# Patient Record
Sex: Female | Born: 1940
Health system: Southern US, Community
[De-identification: ages and names within clinical notes are randomized; demographics above are authoritative.]

## PROBLEM LIST (undated history)

## (undated) DIAGNOSIS — G4730 Sleep apnea, unspecified: Secondary | ICD-10-CM

## (undated) DIAGNOSIS — M1990 Unspecified osteoarthritis, unspecified site: Secondary | ICD-10-CM

## (undated) DIAGNOSIS — E119 Type 2 diabetes mellitus without complications: Secondary | ICD-10-CM

## (undated) DIAGNOSIS — I10 Essential (primary) hypertension: Secondary | ICD-10-CM

## (undated) DIAGNOSIS — E785 Hyperlipidemia, unspecified: Secondary | ICD-10-CM

## (undated) DIAGNOSIS — K219 Gastro-esophageal reflux disease without esophagitis: Secondary | ICD-10-CM

## (undated) HISTORY — PX: TUBAL LIGATION: SHX77

## (undated) HISTORY — PX: CHOLECYSTECTOMY: SHX55

---

## 2001-11-03 ENCOUNTER — Ambulatory Visit (HOSPITAL_COMMUNITY): Admission: RE | Admit: 2001-11-03 | Discharge: 2001-11-03 | Payer: Self-pay | Admitting: Internal Medicine

## 2006-12-22 ENCOUNTER — Ambulatory Visit: Payer: Self-pay | Admitting: Cardiology

## 2006-12-26 ENCOUNTER — Ambulatory Visit: Payer: Self-pay | Admitting: Physician Assistant

## 2007-02-03 ENCOUNTER — Ambulatory Visit: Payer: Self-pay | Admitting: Cardiology

## 2010-07-31 NOTE — Assessment & Plan Note (Signed)
[facility]                          [facility] NOTE   AVILYN, VIRTUE                         MRN:          [id number]  DATE:12/26/2006                            DOB:          11-Aug-1940    REASON FOR REFERRAL:  Chest pain and shortness of breath.   HISTORY OF PRESENT ILLNESS:  Ms. Kristin Barnes is a 70 year old female patient  with essentially a negative past medical history who presents to the  office today for further evaluation of chest pain and shortness of  breath.  She awoke last week from sleep with chest discomfort that she  described as tight.  She had associated shortness of breath,  diaphoresis, and nausea.  It lasted about an hour.  She got up and drank  some soda water as well as some Coca Cola.  She eventually belched and  the symptoms resolved.  She felt somewhat lightheaded but denied any  syncope or near syncope with this.  Over the last couple of years she  has noticed some shortness of breath with exertion.  She denies any  exertional chest pain.  She does note that if she walks up steps she  will get short of breath.  She really describes NYHA Class II-IIB  symptoms.  She sleeps on 2 pillows and has done this for a couple of  years.  She does awaken from time-to-time short of breath.  It is  uncertain whether or not she is really describing PND.  She denies any  pedal edema.  She denies any exertional jaw or arm pain or exertional  nausea or diaphoresis.  She does have some palpitations especially when  she exerts herself and becomes short of breath.   PAST MEDICAL HISTORY:  She denies any history of coronary disease,  hypertension, diabetes, hyperlipidemia, or stroke.  She does note a  history of cholecystectomy.   MEDICATIONS:  None.   ALLERGIES:  None.   SOCIAL HISTORY:  She denies tobacco abuse.  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  She has 2 children and is married for 26 years.   FAMILY HISTORY:  Significant for  CAD.  Her father died of a heart attack  in his 28s.   REVIEW OF SYSTEMS:  Please see HPI.  She does note increased belching  and water brash symptoms.  She denies melena, hematochezia.  She denies  dysphagia, odynophagia.  She denies fevers, chills, cough.  She denies  unilateral weakness, difficulty with speech, facial droop, or monocular  blindness.  She denies any claudication symptoms.  She does note  increased fatigue.  She does note snoring and awakening with a headache.  The rest of the review of systems is negative.   PHYSICAL EXAMINATION:  GENERAL:  She is a well nourished, well developed  female in no distress.  VITAL SIGNS:  Blood pressure 133/76, pulse 85, weight 226.6 pounds.  HEENT:  Normal.  NECK:  Without JVD.  LYMPH:  Without lymphadenopathy.  ENDOCRINE:  Without thyromegaly.  CARDIAC:  Normal S1 S2.  Regular rate and rhythm without murmurs.  LUNGS:  Clear to auscultation bilaterally without wheezes, rhonchi, or  rales.  ABDOMEN:  Soft, nontender with normoactive bowel sounds.  No  organomegaly.  EXTREMITIES:  Without edema.  Calves soft, nontender.  SKIN:  Warm and dry.  NEUROLOGIC:  She is alert and oriented x3.  Cranial nerves II-XII  grossly intact.  Carotids are without bruits bilaterally.   Electrocardiogram reveals a sinus rhythm, heart rate is 83, normal axis,  no acute changes.   DATABASE:  The patient underwent laboratory testing and stress testing  as ordered by Dr. Loney Hering.  This included a D-dimer which was abnormal at  1.12.  CBC was normal.  BUN 12, creatinine 1.2.  CT angiogram of the  chest on December 23, 2006, revealed no evidence of pulmonary embolism or  acute process.  She did have thyromegaly with an element of substernal  goiter.  Stress testing done December 22, 2006, revealed normal LV  profusion with a normal EF of 71%.  Lower extremity Dopplers revealed no  evidence of DVT.  She did have mild degenerative spondylosis on cervical  spine  x-ray.   IMPRESSION:  1. Chest pain.  2. Dyspnea on exertion.  3. Fatigue/snoring.      a.     Rule out sleep apnea.  4. Probable gastroesophageal reflux disease.  5. 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  6. Good left ventricular function.  7. Nonischemic dobutamine Cardiolite, October 2008.  8. Obesity.  9. Remote family history of coronary artery disease.   PLAN:  The patient presents to the office today for further evaluation  of shortness of breath and chest pain.  She had a recent stress  Cardiolite study that was normal.  She does have symptoms that sound  somewhat consistent with GERD.  She also has some symptoms of snoring as  well as fatigue.  I discussed her case further with Dr. Andee Lineman today.   At this point, we will plan to:  1. Initiate proton pump inhibitor for possible GERD.  She will take      omeprazole 20 mg a day.  2. We will set her up with [facility] to rule out the possibility of      sleep apnea.  3. I have asked her to a start diet and exercise program.  She should      try to walk at least 30 minutes a day.  I have also asked her to      look into the [facility] Diet for weight loss.  I have asked her      loose at least 5 pounds before she comes back to see [place].  4. We will bring her back in followup in the next 4-6 weeks to review      her symptoms.  If there has been no improvement or if she has any      worsening symptoms, we may want to      consider further cardiac testing.  Otherwise, no further cardiac      testing is warranted at this point in time.      Tereso Newcomer, PA-C  Electronically Signed      Learta Codding, MD,FACC  Electronically Signed   SW/MedQ  DD: 12/26/2006  DT: 12/26/2006  Job #: [id number]   cc:   Ernestine Conrad, Dr.

## 2010-07-31 NOTE — Assessment & Plan Note (Signed)
[facility]                          [facility] NOTE   Kristin Barnes, Kristin Barnes                         MRN:          [id number]  DATE:02/03/2007                            DOB:          1940/11/21    PRIMARY CARDIOLOGIST:  Dr. Lewayne Bunting.   REASON FOR VISIT:  Scheduled 68-month followup.  Please refer to [name] initial consultation note of October 10th, for full  details.   Since last seen in the clinic, patient has had overnight sleep apnea  screening which did warrant further evaluation.  She was thus referred  to Dr. Ninetta Lights and reports today that she is scheduled for a formal  sleep study on December 13th.   When last seen, patient also presented with symptoms suggestive of GERD.  She was placed on omeprazole 20 daily and reports today a significant  palpable improvement on this medication.  She now has only had  occasional sharp twinges of chest pain in the right upper chest, which  are not strictly correlated with any exertion.  In general, she feels  much better since her last office visit.   CURRENT MEDICATIONS:  1. Ciprofloxacin 500 b.i.d.  2. Metronidazole 250 t.i.d.  3. Omeprazole 20 daily.   PHYSICAL EXAMINATION:  Blood pressure 134/69.  Pulse 74, regular.  Weight 226.6.  GENERAL:  A 70 year old female, moderately obese, sitting upright in no  distress.  HEENT:  Normocephalic and atraumatic.  NECK:  Palpable bilateral carotid pulses without bruits.  Unable to  assess JVD secondary to neck girth.  LUNGS:  Diminished breath sounds in the bases, but without crackles or  wheezes.  HEART:  Regular rate and rhythm (S1 and S2).  No significant murmurs.  ABDOMEN:  Protuberant.  Nontender.  Intact bowel sounds.  EXTREMITIES:  Trace left lower extremity edema.  NEUROLOGIC:  Alert and oriented.   IMPRESSION:  1. Atypical chest pain.      a.     Improved on proton pump inhibitor.      b.     Normal dobutamine stress  Cardiolite; ejection fraction 71%,       October 2008.  2. Probable gastroesophageal reflux disease.  3. Question obstructive sleep apnea.      a.     Formal sleep study scheduled.  4. Obesity.  5. Mild lower extremity edema.   PLAN:  No further cardiac workup is indicated at this time.  Patient's  symptoms have improved with a proton pump inhibitor and are suggestive  of probable GERD.  In light of a recent normal dobutamine stress  Cardiolite, therefore, no further ischemic workup is currently  indicated.  Patient is instructed to follow up with Dr. Ninetta Lights for  continued evaluation for possible obstructive sleep apnea.  She is to  otherwise continue regular scheduled followup with Dr. Loney Hering, as  previously scheduled.   We will have patient return to Dr. Lewayne Bunting on an as needed basis.      Gene Serpe, PA-C  Electronically Signed      Learta Codding, MD,FACC  Electronically Signed  GS/MedQ  DD: 02/03/2007  DT: 02/04/2007  Job #: [id number]   cc:   Ernestine Conrad, M.D.

## 2010-08-03 NOTE — Consult Note (Signed)
Kristin Barnes, Kristin Barnes                            ACCOUNT NO.:  [id number]   MEDICAL RECORD NO.:  [id number]                   PATIENT TYPE:   LOCATION:                                       FACILITY:   PHYSICIAN:  Tana Coast, PA                    DATE OF BIRTH:  June 29, 1940   DATE OF CONSULTATION:  DATE OF DISCHARGE:                                   CONSULTATION   REASON FOR CONSULTATION:  Chronic nausea.   HISTORY OF PRESENT ILLNESS:  The patient is a pleasant 70 year old black  female with a nine month history of chronic nausea.  She presents today at  the request of Dr. Johny Shears for further evaluation.  The patient tells me  that she has been having daily nausea for the past nine months.  Her nausea  seems to be worse in the morning times and gradually gets better with  eating.  Nausea seems to be worse if she does not eat.  She has associated  epigastric nagging type pain.  She also describes a knotting sensation in  this area.  There is no change in her bowel habits.  Typically has one BM  daily.  No melena, rectal bleeding, fever, or chills.  No typical heartburn  symptoms, dysphagia, odynophagia.  She has not really tried any medications  for this.  She was asked to try Pepcid or Tums which she only took a couple  of times, but did not notice any relief.  She has been on Celebrex for  approximately three years.  Prior to that she took Goody's powders quite  regularly.  No recent aspirin use.  She has problems with insomnia, but  usually sleeps well as long as she takes her nortriptyline.  Denies any  history of depression or anxiety.  Nausea does not seem to go along with any  stresses.  Not particularly worse with movements or driving in a car.   CURRENT MEDICATIONS:  1. Nortryptline 25 mg q.h.s.  2. Celebrex 200 mg q.d.  3. Xanaflex 4 mg t.i.d.   ALLERGIES:  No known drug allergies.   PAST MEDICAL HISTORY:  Slipped disk in her lumbar region, arthritis,   insomnia.   PAST SURGICAL HISTORY:  Right ovarian cyst removed 20 years ago.   FAMILY HISTORY:  Mother and father both died of old age at 68 and 26,  respectively.  Sister recently diagnosed with breast cancer about a month  ago.  No family history of colorectal cancer or chronic GI illnesses.   SOCIAL HISTORY:  She has been married for 37 years.  Has two children.  She  is a housewife.  She has never been a smoker.  Denies alcohol use.   REVIEW OF SYMPTOMS:  Please see HPI for GI.  Denies any weight loss.  GENITOURINARY:  Has not menstruated in over five years.  Complains  of  urinary incontinence at times.   PHYSICAL EXAMINATION:  VITAL SIGNS:  Weight 226.25 pounds, height 5 feet 2  inches, temperature 97.7, blood pressure 134/74, pulse 70.  GENERAL:  Pleasant, well-nourished, well-developed black female in no acute  distress.  SKIN:  Warm and dry.  No jaundice.  HEENT:  Conjunctivae are pink.  Sclerae nonicteric.  Oropharyngeal mucosa  moist and pink.  No lesions, erythema, or exudate.  No lymphadenopathy,  thyromegaly, carotid bruits.  CHEST:  Lungs clear to auscultation.  CARDIAC:  Regular rate and rhythm.  Normal S1, S2.  No murmurs, rubs, or  gallops.  ABDOMEN:  Positive bowel sounds, obese, but symmetrical, soft, nontender,  nondistended.  No organomegaly or masses.  RECTAL:  One small external hemorrhoid.  No masses in rectal vault.  Brown  stool.  Hemoccult negative.  EXTREMITIES:  No edema.   IMPRESSION:  The patient is a pleasant 70 year old female with a nine month  history of daily nausea.  She denies any episodes of vomiting.  She also has  associated epigastric discomfort.  She has been on Celebrex for  approximately three years, prior to that significant Goody's powders use.  Secondary to the chronicity of her symptoms, would recommend EGD at this  time.  We could be dealing with peptic ulcer disease, dyspepsia,  gastroesophageal reflux disease.  As symptoms seem  to get better with  eating, would not suspect biliary etiology.  She has also never had a  colonoscopy, so would recommend one at some point in the near future.   PLAN:  1. EGD.  2. The patient did not want any antiemetic therapy.  3. Further recommendations to follow.  4. Recommend colonoscopy at some point in the near future.   I would like to thank Dr. Johny Shears for allowing [place] to take part in the care  of this patient.                                               Tana Coast, PA    LL/MEDQ  D:  10/26/2001  T:  10/30/2001  Job:  [id number]

## 2010-08-03 NOTE — Op Note (Signed)
NAMEKHAMILLE, Kristin Barnes                            ACCOUNT NO.:  [id number]   MEDICAL RECORD NO.:  [id number]                   PATIENT TYPE:  AMB   LOCATION:  DAY                                  FACILITY:  [facility]   PHYSICIAN:  Lionel December, M.D.                 DATE OF BIRTH:  [birth year]   DATE OF PROCEDURE:  11/03/2001  DATE OF DISCHARGE:  11/03/2001                                 OPERATIVE REPORT   PROCEDURE:  Esophagogastroduodenoscopy.   ENDOSCOPIST:  Lionel December, M.D.   INDICATIONS:  This patient is a 70 year old African American female with a  nine month history of nausea which is not associated with meals and  intermittent epigastric pain. She is on Celebrex.  She has taken H2Ps with  out any improvement.  She is undergoing diagnostic  esophagogastroduodenoscopy.  Procedure and risks reviewed with the patient  and informed consent was obtained.   PREOPERATIVE MEDICATIONS:  Cetacaine spray for pharyngeal topical  anesthesia, Demerol 25 mg IV and Versed 4 mg IV in divided dose.   INSTRUMENT:  Olympus video system.   FINDINGS:  Procedure performed in endoscopy suite.  The patient's vital  signs and O2 saturation were monitored during the procedure and remained  stable.  The patient was placed in the left lateral recumbent position and  endoscope was passed via the oropharynx without any difficulty into the  esophagus.   ESOPHAGUS:  Mucosa of the esophagus was normal throughout.  Squamocolumnar  junction was unremarkable.  Pictures taken, just part of a data base.  No  hernia was present.   STOMACH:  It was empty and distended very well with insufflation.  The folds  of the proximal stomach were normal.  A few petechiae were noted at the  gastric body and linear streaks or erythematous mucosa at antrum.  However,  no erosions or ulcers were noted.  Pyloric channel was patent.  Angularis,  fundus and cardia were examined by retroflexing the scope and were normal.   DUODENUM:  Examination of the bulb and second part of the duodenum was  normal.   Endoscope was withdrawn. The patient tolerated the procedure well.   FINAL DIAGNOSIS:  Nonerosive gastritis in body and antrum.  Otherwise normal  examination.   RECOMMENDATIONS:  1. H. pylori serology will be checked today.  2.     Trial with PPI.  She will pick up Prevacid samples from the office, 30 mg     p.o. q.a.m.  3. Upper abdominal ultrasound will be obtained at admission in near future.  4. Will  plan to see her back in the office in 3-4 weeks.  Lionel December, M.D.    NR/MEDQ  D:  11/03/2001  T:  11/05/2001  Job:  [id number]   cc:   Zigmund Gottron

## 2015-04-18 DIAGNOSIS — Z79899 Other long term (current) drug therapy: Secondary | ICD-10-CM | POA: Diagnosis not present

## 2015-04-18 DIAGNOSIS — Z0000 Encounter for general adult medical examination without abnormal findings: Secondary | ICD-10-CM | POA: Diagnosis not present

## 2015-04-18 DIAGNOSIS — Z1389 Encounter for screening for other disorder: Secondary | ICD-10-CM | POA: Diagnosis not present

## 2015-04-18 DIAGNOSIS — Z6841 Body Mass Index (BMI) 40.0 and over, adult: Secondary | ICD-10-CM | POA: Diagnosis not present

## 2015-04-18 DIAGNOSIS — Z1211 Encounter for screening for malignant neoplasm of colon: Secondary | ICD-10-CM | POA: Diagnosis not present

## 2015-04-18 DIAGNOSIS — Z7189 Other specified counseling: Secondary | ICD-10-CM | POA: Diagnosis not present

## 2015-04-18 DIAGNOSIS — Z418 Encounter for other procedures for purposes other than remedying health state: Secondary | ICD-10-CM | POA: Diagnosis not present

## 2015-04-18 DIAGNOSIS — E7800 Pure hypercholesterolemia, unspecified: Secondary | ICD-10-CM | POA: Diagnosis not present

## 2015-04-18 DIAGNOSIS — R5383 Other fatigue: Secondary | ICD-10-CM | POA: Diagnosis not present

## 2015-04-20 DIAGNOSIS — Z9049 Acquired absence of other specified parts of digestive tract: Secondary | ICD-10-CM | POA: Diagnosis not present

## 2015-04-20 DIAGNOSIS — I159 Secondary hypertension, unspecified: Secondary | ICD-10-CM | POA: Diagnosis not present

## 2015-04-20 DIAGNOSIS — E119 Type 2 diabetes mellitus without complications: Secondary | ICD-10-CM | POA: Diagnosis not present

## 2015-04-20 DIAGNOSIS — Z87442 Personal history of urinary calculi: Secondary | ICD-10-CM | POA: Diagnosis not present

## 2015-04-20 DIAGNOSIS — R1013 Epigastric pain: Secondary | ICD-10-CM | POA: Diagnosis not present

## 2015-04-20 DIAGNOSIS — R109 Unspecified abdominal pain: Secondary | ICD-10-CM | POA: Diagnosis not present

## 2015-04-20 DIAGNOSIS — I10 Essential (primary) hypertension: Secondary | ICD-10-CM | POA: Diagnosis not present

## 2015-04-20 DIAGNOSIS — R079 Chest pain, unspecified: Secondary | ICD-10-CM | POA: Diagnosis not present

## 2015-04-20 DIAGNOSIS — Z79899 Other long term (current) drug therapy: Secondary | ICD-10-CM | POA: Diagnosis not present

## 2015-04-20 DIAGNOSIS — Z87440 Personal history of urinary (tract) infections: Secondary | ICD-10-CM | POA: Diagnosis not present

## 2015-04-20 DIAGNOSIS — R1011 Right upper quadrant pain: Secondary | ICD-10-CM | POA: Diagnosis not present

## 2015-06-08 DIAGNOSIS — E7800 Pure hypercholesterolemia, unspecified: Secondary | ICD-10-CM | POA: Diagnosis not present

## 2015-06-08 DIAGNOSIS — I10 Essential (primary) hypertension: Secondary | ICD-10-CM | POA: Diagnosis not present

## 2015-06-26 DIAGNOSIS — E119 Type 2 diabetes mellitus without complications: Secondary | ICD-10-CM | POA: Diagnosis not present

## 2015-07-17 DIAGNOSIS — I10 Essential (primary) hypertension: Secondary | ICD-10-CM | POA: Diagnosis not present

## 2015-07-17 DIAGNOSIS — E7800 Pure hypercholesterolemia, unspecified: Secondary | ICD-10-CM | POA: Diagnosis not present

## 2015-07-17 DIAGNOSIS — Z789 Other specified health status: Secondary | ICD-10-CM | POA: Diagnosis not present

## 2015-08-26 DIAGNOSIS — R1084 Generalized abdominal pain: Secondary | ICD-10-CM | POA: Diagnosis not present

## 2015-08-26 DIAGNOSIS — K5900 Constipation, unspecified: Secondary | ICD-10-CM | POA: Diagnosis not present

## 2015-09-01 DIAGNOSIS — K5900 Constipation, unspecified: Secondary | ICD-10-CM | POA: Diagnosis not present

## 2015-09-13 DIAGNOSIS — E7800 Pure hypercholesterolemia, unspecified: Secondary | ICD-10-CM | POA: Diagnosis not present

## 2015-09-13 DIAGNOSIS — I10 Essential (primary) hypertension: Secondary | ICD-10-CM | POA: Diagnosis not present

## 2015-10-12 DIAGNOSIS — E7800 Pure hypercholesterolemia, unspecified: Secondary | ICD-10-CM | POA: Diagnosis not present

## 2015-10-12 DIAGNOSIS — I10 Essential (primary) hypertension: Secondary | ICD-10-CM | POA: Diagnosis not present

## 2015-11-03 DIAGNOSIS — E7800 Pure hypercholesterolemia, unspecified: Secondary | ICD-10-CM | POA: Diagnosis not present

## 2015-11-03 DIAGNOSIS — I10 Essential (primary) hypertension: Secondary | ICD-10-CM | POA: Diagnosis not present

## 2015-11-10 DIAGNOSIS — Z6841 Body Mass Index (BMI) 40.0 and over, adult: Secondary | ICD-10-CM | POA: Diagnosis not present

## 2015-11-10 DIAGNOSIS — I10 Essential (primary) hypertension: Secondary | ICD-10-CM | POA: Diagnosis not present

## 2015-11-10 DIAGNOSIS — I80299 Phlebitis and thrombophlebitis of other deep vessels of unspecified lower extremity: Secondary | ICD-10-CM | POA: Diagnosis not present

## 2015-11-10 DIAGNOSIS — R0789 Other chest pain: Secondary | ICD-10-CM | POA: Diagnosis not present

## 2015-11-21 DIAGNOSIS — I10 Essential (primary) hypertension: Secondary | ICD-10-CM | POA: Diagnosis not present

## 2015-11-21 DIAGNOSIS — R0789 Other chest pain: Secondary | ICD-10-CM | POA: Diagnosis not present

## 2016-01-04 DIAGNOSIS — E7800 Pure hypercholesterolemia, unspecified: Secondary | ICD-10-CM | POA: Diagnosis not present

## 2016-01-04 DIAGNOSIS — I10 Essential (primary) hypertension: Secondary | ICD-10-CM | POA: Diagnosis not present

## 2016-02-12 DIAGNOSIS — R928 Other abnormal and inconclusive findings on diagnostic imaging of breast: Secondary | ICD-10-CM | POA: Diagnosis not present

## 2016-02-12 DIAGNOSIS — Z1231 Encounter for screening mammogram for malignant neoplasm of breast: Secondary | ICD-10-CM | POA: Diagnosis not present

## 2016-02-21 DIAGNOSIS — R928 Other abnormal and inconclusive findings on diagnostic imaging of breast: Secondary | ICD-10-CM | POA: Diagnosis not present

## 2016-02-21 DIAGNOSIS — N6489 Other specified disorders of breast: Secondary | ICD-10-CM | POA: Diagnosis not present

## 2016-02-28 DIAGNOSIS — R0789 Other chest pain: Secondary | ICD-10-CM | POA: Diagnosis not present

## 2016-02-28 DIAGNOSIS — Z6841 Body Mass Index (BMI) 40.0 and over, adult: Secondary | ICD-10-CM | POA: Diagnosis not present

## 2016-02-28 DIAGNOSIS — I10 Essential (primary) hypertension: Secondary | ICD-10-CM | POA: Diagnosis not present

## 2016-02-28 DIAGNOSIS — R1011 Right upper quadrant pain: Secondary | ICD-10-CM | POA: Diagnosis not present

## 2016-03-01 DIAGNOSIS — I10 Essential (primary) hypertension: Secondary | ICD-10-CM | POA: Diagnosis not present

## 2016-03-01 DIAGNOSIS — R1011 Right upper quadrant pain: Secondary | ICD-10-CM | POA: Diagnosis not present

## 2016-03-04 DIAGNOSIS — I10 Essential (primary) hypertension: Secondary | ICD-10-CM | POA: Diagnosis not present

## 2016-03-04 DIAGNOSIS — E7800 Pure hypercholesterolemia, unspecified: Secondary | ICD-10-CM | POA: Diagnosis not present

## 2016-03-04 DIAGNOSIS — Z9049 Acquired absence of other specified parts of digestive tract: Secondary | ICD-10-CM | POA: Diagnosis not present

## 2016-03-04 DIAGNOSIS — K429 Umbilical hernia without obstruction or gangrene: Secondary | ICD-10-CM | POA: Diagnosis not present

## 2016-03-04 DIAGNOSIS — K760 Fatty (change of) liver, not elsewhere classified: Secondary | ICD-10-CM | POA: Diagnosis not present

## 2016-03-04 DIAGNOSIS — R1011 Right upper quadrant pain: Secondary | ICD-10-CM | POA: Diagnosis not present

## 2016-03-04 DIAGNOSIS — K5730 Diverticulosis of large intestine without perforation or abscess without bleeding: Secondary | ICD-10-CM | POA: Diagnosis not present

## 2016-03-21 DIAGNOSIS — I10 Essential (primary) hypertension: Secondary | ICD-10-CM | POA: Diagnosis not present

## 2016-03-21 DIAGNOSIS — E7800 Pure hypercholesterolemia, unspecified: Secondary | ICD-10-CM | POA: Diagnosis not present

## 2016-04-22 DIAGNOSIS — Z7189 Other specified counseling: Secondary | ICD-10-CM | POA: Diagnosis not present

## 2016-04-22 DIAGNOSIS — Z789 Other specified health status: Secondary | ICD-10-CM | POA: Diagnosis not present

## 2016-04-22 DIAGNOSIS — E2839 Other primary ovarian failure: Secondary | ICD-10-CM | POA: Diagnosis not present

## 2016-04-22 DIAGNOSIS — R5383 Other fatigue: Secondary | ICD-10-CM | POA: Diagnosis not present

## 2016-04-22 DIAGNOSIS — Z1389 Encounter for screening for other disorder: Secondary | ICD-10-CM | POA: Diagnosis not present

## 2016-04-22 DIAGNOSIS — E7800 Pure hypercholesterolemia, unspecified: Secondary | ICD-10-CM | POA: Diagnosis not present

## 2016-04-22 DIAGNOSIS — Z79899 Other long term (current) drug therapy: Secondary | ICD-10-CM | POA: Diagnosis not present

## 2016-04-22 DIAGNOSIS — Z1211 Encounter for screening for malignant neoplasm of colon: Secondary | ICD-10-CM | POA: Diagnosis not present

## 2016-04-22 DIAGNOSIS — Z0000 Encounter for general adult medical examination without abnormal findings: Secondary | ICD-10-CM | POA: Diagnosis not present

## 2016-04-22 DIAGNOSIS — Z299 Encounter for prophylactic measures, unspecified: Secondary | ICD-10-CM | POA: Diagnosis not present

## 2016-04-22 DIAGNOSIS — Z713 Dietary counseling and surveillance: Secondary | ICD-10-CM | POA: Diagnosis not present

## 2016-04-22 DIAGNOSIS — Z6841 Body Mass Index (BMI) 40.0 and over, adult: Secondary | ICD-10-CM | POA: Diagnosis not present

## 2016-05-13 DIAGNOSIS — I10 Essential (primary) hypertension: Secondary | ICD-10-CM | POA: Diagnosis not present

## 2016-05-13 DIAGNOSIS — E7800 Pure hypercholesterolemia, unspecified: Secondary | ICD-10-CM | POA: Diagnosis not present

## 2016-05-14 DIAGNOSIS — E2839 Other primary ovarian failure: Secondary | ICD-10-CM | POA: Diagnosis not present

## 2016-05-20 DIAGNOSIS — J209 Acute bronchitis, unspecified: Secondary | ICD-10-CM | POA: Diagnosis not present

## 2016-05-31 DIAGNOSIS — E785 Hyperlipidemia, unspecified: Secondary | ICD-10-CM | POA: Diagnosis not present

## 2016-05-31 DIAGNOSIS — R938 Abnormal findings on diagnostic imaging of other specified body structures: Secondary | ICD-10-CM | POA: Diagnosis not present

## 2016-05-31 DIAGNOSIS — I10 Essential (primary) hypertension: Secondary | ICD-10-CM | POA: Diagnosis not present

## 2016-05-31 DIAGNOSIS — R001 Bradycardia, unspecified: Secondary | ICD-10-CM | POA: Diagnosis not present

## 2016-05-31 DIAGNOSIS — E049 Nontoxic goiter, unspecified: Secondary | ICD-10-CM | POA: Diagnosis not present

## 2016-05-31 DIAGNOSIS — R0789 Other chest pain: Secondary | ICD-10-CM | POA: Diagnosis not present

## 2016-05-31 DIAGNOSIS — Z8709 Personal history of other diseases of the respiratory system: Secondary | ICD-10-CM | POA: Diagnosis not present

## 2016-05-31 DIAGNOSIS — R002 Palpitations: Secondary | ICD-10-CM | POA: Diagnosis not present

## 2016-05-31 DIAGNOSIS — R05 Cough: Secondary | ICD-10-CM | POA: Diagnosis not present

## 2016-05-31 DIAGNOSIS — R0602 Shortness of breath: Secondary | ICD-10-CM | POA: Diagnosis not present

## 2016-05-31 DIAGNOSIS — Z8249 Family history of ischemic heart disease and other diseases of the circulatory system: Secondary | ICD-10-CM | POA: Diagnosis not present

## 2016-05-31 DIAGNOSIS — J111 Influenza due to unidentified influenza virus with other respiratory manifestations: Secondary | ICD-10-CM | POA: Diagnosis not present

## 2016-06-01 DIAGNOSIS — R938 Abnormal findings on diagnostic imaging of other specified body structures: Secondary | ICD-10-CM | POA: Diagnosis not present

## 2016-06-01 DIAGNOSIS — R0789 Other chest pain: Secondary | ICD-10-CM | POA: Diagnosis not present

## 2016-06-01 DIAGNOSIS — R0602 Shortness of breath: Secondary | ICD-10-CM | POA: Diagnosis not present

## 2016-06-01 DIAGNOSIS — J111 Influenza due to unidentified influenza virus with other respiratory manifestations: Secondary | ICD-10-CM | POA: Diagnosis not present

## 2016-06-05 DIAGNOSIS — I10 Essential (primary) hypertension: Secondary | ICD-10-CM | POA: Diagnosis not present

## 2016-06-05 DIAGNOSIS — E041 Nontoxic single thyroid nodule: Secondary | ICD-10-CM | POA: Diagnosis not present

## 2016-06-05 DIAGNOSIS — J209 Acute bronchitis, unspecified: Secondary | ICD-10-CM | POA: Diagnosis not present

## 2016-06-05 DIAGNOSIS — E7800 Pure hypercholesterolemia, unspecified: Secondary | ICD-10-CM | POA: Diagnosis not present

## 2016-06-05 DIAGNOSIS — Z299 Encounter for prophylactic measures, unspecified: Secondary | ICD-10-CM | POA: Diagnosis not present

## 2016-06-10 DIAGNOSIS — E011 Iodine-deficiency related multinodular (endemic) goiter: Secondary | ICD-10-CM | POA: Diagnosis not present

## 2016-06-10 DIAGNOSIS — E042 Nontoxic multinodular goiter: Secondary | ICD-10-CM | POA: Diagnosis not present

## 2016-06-12 DIAGNOSIS — I10 Essential (primary) hypertension: Secondary | ICD-10-CM | POA: Diagnosis not present

## 2016-06-12 DIAGNOSIS — E7800 Pure hypercholesterolemia, unspecified: Secondary | ICD-10-CM | POA: Diagnosis not present

## 2016-06-19 DIAGNOSIS — I10 Essential (primary) hypertension: Secondary | ICD-10-CM | POA: Diagnosis not present

## 2016-06-19 DIAGNOSIS — E7800 Pure hypercholesterolemia, unspecified: Secondary | ICD-10-CM | POA: Diagnosis not present

## 2016-06-19 DIAGNOSIS — Z6841 Body Mass Index (BMI) 40.0 and over, adult: Secondary | ICD-10-CM | POA: Diagnosis not present

## 2016-06-19 DIAGNOSIS — E041 Nontoxic single thyroid nodule: Secondary | ICD-10-CM | POA: Diagnosis not present

## 2016-06-19 DIAGNOSIS — Z299 Encounter for prophylactic measures, unspecified: Secondary | ICD-10-CM | POA: Diagnosis not present

## 2016-07-15 ENCOUNTER — Ambulatory Visit (INDEPENDENT_AMBULATORY_CARE_PROVIDER_SITE_OTHER): Payer: Medicare Other | Admitting: Otolaryngology

## 2016-07-15 DIAGNOSIS — R1312 Dysphagia, oropharyngeal phase: Secondary | ICD-10-CM

## 2016-07-15 DIAGNOSIS — D440 Neoplasm of uncertain behavior of thyroid gland: Secondary | ICD-10-CM | POA: Diagnosis not present

## 2016-07-15 DIAGNOSIS — R490 Dysphonia: Secondary | ICD-10-CM | POA: Diagnosis not present

## 2016-07-17 ENCOUNTER — Other Ambulatory Visit: Payer: Self-pay | Admitting: Otolaryngology

## 2016-08-13 ENCOUNTER — Encounter (HOSPITAL_BASED_OUTPATIENT_CLINIC_OR_DEPARTMENT_OTHER): Payer: Self-pay | Admitting: *Deleted

## 2016-08-19 ENCOUNTER — Ambulatory Visit (HOSPITAL_BASED_OUTPATIENT_CLINIC_OR_DEPARTMENT_OTHER): Payer: Medicare Other | Admitting: Anesthesiology

## 2016-08-19 ENCOUNTER — Encounter (HOSPITAL_BASED_OUTPATIENT_CLINIC_OR_DEPARTMENT_OTHER): Payer: Self-pay | Admitting: Anesthesiology

## 2016-08-19 ENCOUNTER — Ambulatory Visit (HOSPITAL_BASED_OUTPATIENT_CLINIC_OR_DEPARTMENT_OTHER)
Admission: RE | Admit: 2016-08-19 | Discharge: 2016-08-20 | Disposition: A | Discharge status: Home / Self Care | Payer: Medicare Other | Source: Ambulatory Visit | Attending: Otolaryngology | Admitting: Otolaryngology

## 2016-08-19 ENCOUNTER — Encounter (HOSPITAL_BASED_OUTPATIENT_CLINIC_OR_DEPARTMENT_OTHER)
Admission: RE | Disposition: A | Discharge status: Home / Self Care | Payer: Self-pay | Source: Ambulatory Visit | Attending: Otolaryngology

## 2016-08-19 DIAGNOSIS — I10 Essential (primary) hypertension: Secondary | ICD-10-CM | POA: Diagnosis not present

## 2016-08-19 DIAGNOSIS — D440 Neoplasm of uncertain behavior of thyroid gland: Secondary | ICD-10-CM | POA: Diagnosis not present

## 2016-08-19 DIAGNOSIS — E065 Other chronic thyroiditis: Secondary | ICD-10-CM | POA: Diagnosis not present

## 2016-08-19 DIAGNOSIS — G4730 Sleep apnea, unspecified: Secondary | ICD-10-CM | POA: Diagnosis not present

## 2016-08-19 DIAGNOSIS — K219 Gastro-esophageal reflux disease without esophagitis: Secondary | ICD-10-CM | POA: Insufficient documentation

## 2016-08-19 DIAGNOSIS — E042 Nontoxic multinodular goiter: Secondary | ICD-10-CM | POA: Diagnosis not present

## 2016-08-19 DIAGNOSIS — Z9889 Other specified postprocedural states: Secondary | ICD-10-CM

## 2016-08-19 DIAGNOSIS — E890 Postprocedural hypothyroidism: Secondary | ICD-10-CM

## 2016-08-19 DIAGNOSIS — E041 Nontoxic single thyroid nodule: Secondary | ICD-10-CM | POA: Diagnosis not present

## 2016-08-19 DIAGNOSIS — Z79899 Other long term (current) drug therapy: Secondary | ICD-10-CM | POA: Insufficient documentation

## 2016-08-19 HISTORY — DX: Unspecified osteoarthritis, unspecified site: M19.90

## 2016-08-19 HISTORY — DX: Essential (primary) hypertension: I10

## 2016-08-19 HISTORY — PX: THYROIDECTOMY: SHX17

## 2016-08-19 HISTORY — DX: Hyperlipidemia, unspecified: E78.5

## 2016-08-19 HISTORY — DX: Gastro-esophageal reflux disease without esophagitis: K21.9

## 2016-08-19 HISTORY — DX: Sleep apnea, unspecified: G47.30

## 2016-08-19 SURGERY — THYROIDECTOMY
Anesthesia: General | Laterality: Right

## 2016-08-19 MED ORDER — ATROPINE SULFATE 0.4 MG/ML IJ SOLN
INTRAMUSCULAR | Status: AC
  Filled 2016-08-19: qty 1

## 2016-08-19 MED ORDER — CEFAZOLIN SODIUM-DEXTROSE 2-3 GM-% IV SOLR
INTRAVENOUS | Status: DC | PRN
  Administered 2016-08-19: 2 g via INTRAVENOUS

## 2016-08-19 MED ORDER — ACETAMINOPHEN 500 MG PO TABS: 1000.0000 mg | ORAL_TABLET | Freq: Once | ORAL | Status: DC

## 2016-08-19 MED ORDER — FENTANYL CITRATE (PF) 100 MCG/2ML IJ SOLN
INTRAMUSCULAR | Status: AC
  Filled 2016-08-19: qty 2

## 2016-08-19 MED ORDER — ATORVASTATIN CALCIUM 10 MG PO TABS
10.0000 mg | ORAL_TABLET | Freq: Every day | ORAL | Status: DC
  Administered 2016-08-19: 10 mg via ORAL

## 2016-08-19 MED ORDER — SUCCINYLCHOLINE CHLORIDE 200 MG/10ML IV SOSY
PREFILLED_SYRINGE | INTRAVENOUS | Status: AC
  Filled 2016-08-19: qty 10

## 2016-08-19 MED ORDER — LACTATED RINGERS IV SOLN
INTRAVENOUS | Status: DC
  Administered 2016-08-19 (×2): via INTRAVENOUS

## 2016-08-19 MED ORDER — EPHEDRINE SULFATE 50 MG/ML IJ SOLN
INTRAMUSCULAR | Status: DC | PRN
  Administered 2016-08-19: 10 mg via INTRAVENOUS

## 2016-08-19 MED ORDER — DEXAMETHASONE SODIUM PHOSPHATE 10 MG/ML IJ SOLN
INTRAMUSCULAR | Status: AC
  Filled 2016-08-19: qty 1

## 2016-08-19 MED ORDER — KCL IN DEXTROSE-NACL 20-5-0.45 MEQ/L-%-% IV SOLN
INTRAVENOUS | Status: DC
  Administered 2016-08-19: 11:00:00 via INTRAVENOUS
  Filled 2016-08-19: qty 1000

## 2016-08-19 MED ORDER — PHENYLEPHRINE HCL 10 MG/ML IJ SOLN
INTRAVENOUS | Status: DC | PRN
  Administered 2016-08-19: 50 ug/min via INTRAVENOUS

## 2016-08-19 MED ORDER — AMOXICILLIN 875 MG PO TABS: 875.0000 mg | ORAL_TABLET | Freq: Two times a day (BID) | ORAL | 0 refills | Status: DC

## 2016-08-19 MED ORDER — ONDANSETRON HCL 4 MG/2ML IJ SOLN
INTRAMUSCULAR | Status: AC
  Filled 2016-08-19: qty 2

## 2016-08-19 MED ORDER — DEXAMETHASONE SODIUM PHOSPHATE 4 MG/ML IJ SOLN
INTRAMUSCULAR | Status: DC | PRN
  Administered 2016-08-19: 10 mg via INTRAVENOUS

## 2016-08-19 MED ORDER — PHENYLEPHRINE HCL 10 MG/ML IJ SOLN
INTRAMUSCULAR | Status: AC
  Filled 2016-08-19: qty 1

## 2016-08-19 MED ORDER — PROPOFOL 10 MG/ML IV BOLUS
INTRAVENOUS | Status: AC
  Filled 2016-08-19: qty 20

## 2016-08-19 MED ORDER — LIDOCAINE 2% (20 MG/ML) 5 ML SYRINGE
INTRAMUSCULAR | Status: AC
  Filled 2016-08-19: qty 5

## 2016-08-19 MED ORDER — SUCCINYLCHOLINE CHLORIDE 20 MG/ML IJ SOLN
INTRAMUSCULAR | Status: DC | PRN
Start: 2016-08-19 — End: 2016-08-19
  Administered 2016-08-19: 100 mg via INTRAVENOUS

## 2016-08-19 MED ORDER — PROPOFOL 10 MG/ML IV BOLUS
INTRAVENOUS | Status: DC | PRN
Start: 2016-08-19 — End: 2016-08-19
  Administered 2016-08-19: 200 mg via INTRAVENOUS

## 2016-08-19 MED ORDER — ONDANSETRON HCL 4 MG/2ML IJ SOLN: 4.0000 mg | Freq: Once | INTRAMUSCULAR | Status: DC | PRN

## 2016-08-19 MED ORDER — FENTANYL CITRATE (PF) 100 MCG/2ML IJ SOLN
50.0000 ug | INTRAMUSCULAR | Status: DC | PRN
  Administered 2016-08-19: 100 ug via INTRAVENOUS
  Administered 2016-08-19: 50 ug via INTRAVENOUS

## 2016-08-19 MED ORDER — LIDOCAINE-EPINEPHRINE 1 %-1:100000 IJ SOLN
INTRAMUSCULAR | Status: DC | PRN
  Administered 2016-08-19: 3 mL

## 2016-08-19 MED ORDER — SCOPOLAMINE 1 MG/3DAYS TD PT72
1.0000 | MEDICATED_PATCH | Freq: Once | TRANSDERMAL | Status: DC | PRN
Start: 2016-08-19 — End: 2016-08-19

## 2016-08-19 MED ORDER — OXYCODONE-ACETAMINOPHEN 5-325 MG PO TABS: 1.0000 | ORAL_TABLET | ORAL | 0 refills | Status: DC | PRN

## 2016-08-19 MED ORDER — LIDOCAINE 2% (20 MG/ML) 5 ML SYRINGE
INTRAMUSCULAR | Status: DC | PRN
  Administered 2016-08-19: 100 mg via INTRAVENOUS

## 2016-08-19 MED ORDER — ONDANSETRON HCL 4 MG PO TABS: 4.0000 mg | ORAL_TABLET | ORAL | Status: DC | PRN

## 2016-08-19 MED ORDER — MORPHINE SULFATE (PF) 2 MG/ML IV SOLN: 2.0000 mg | INTRAVENOUS | Status: DC | PRN

## 2016-08-19 MED ORDER — MIDAZOLAM HCL 2 MG/2ML IJ SOLN: 1.0000 mg | INTRAMUSCULAR | Status: DC | PRN

## 2016-08-19 MED ORDER — OXYCODONE-ACETAMINOPHEN 5-325 MG PO TABS
1.0000 | ORAL_TABLET | ORAL | Status: DC | PRN
  Administered 2016-08-19 (×3): 2 via ORAL
  Filled 2016-08-19 (×3): qty 2

## 2016-08-19 MED ORDER — LOSARTAN POTASSIUM 25 MG PO TABS
25.0000 mg | ORAL_TABLET | Freq: Every day | ORAL | Status: DC
  Administered 2016-08-19: 25 mg via ORAL

## 2016-08-19 MED ORDER — FENTANYL CITRATE (PF) 100 MCG/2ML IJ SOLN
25.0000 ug | INTRAMUSCULAR | Status: DC | PRN
  Administered 2016-08-19 (×2): 25 ug via INTRAVENOUS

## 2016-08-19 MED ORDER — ONDANSETRON HCL 4 MG/2ML IJ SOLN: 4.0000 mg | INTRAMUSCULAR | Status: DC | PRN

## 2016-08-19 SURGICAL SUPPLY — 63 items
ATTRACTOMAT 16X20 MAGNETIC DRP (DRAPES) IMPLANT
BLADE CLIPPER SURG (BLADE) IMPLANT
BLADE SURG 10 STRL SS (BLADE) IMPLANT
BLADE SURG 15 STRL LF DISP TIS (BLADE) ×1 IMPLANT
BLADE SURG 15 STRL SS (BLADE) ×2
CANISTER SUCT 1200ML W/VALVE (MISCELLANEOUS) ×3 IMPLANT
CLIP TI WIDE RED SMALL 6 (CLIP) IMPLANT
CORDS BIPOLAR (ELECTRODE) ×3 IMPLANT
COVER BACK TABLE 60X90IN (DRAPES) ×3 IMPLANT
COVER MAYO STAND STRL (DRAPES) ×3 IMPLANT
DECANTER SPIKE VIAL GLASS SM (MISCELLANEOUS) IMPLANT
DERMABOND ADVANCED (GAUZE/BANDAGES/DRESSINGS) ×2
DERMABOND ADVANCED .7 DNX12 (GAUZE/BANDAGES/DRESSINGS) ×1 IMPLANT
DRAIN CHANNEL 10F 3/8 F FF (DRAIN) ×3 IMPLANT
DRAIN CHANNEL 7F FF FLAT (WOUND CARE) IMPLANT
DRAPE U-SHAPE 76X120 STRL (DRAPES) ×3 IMPLANT
ELECT COATED BLADE 2.86 ST (ELECTRODE) ×3 IMPLANT
ELECT REM PT RETURN 9FT ADLT (ELECTROSURGICAL) ×3
ELECTRODE REM PT RTRN 9FT ADLT (ELECTROSURGICAL) ×1 IMPLANT
EVACUATOR SILICONE 100CC (DRAIN) ×3 IMPLANT
FORCEPS BIPOLAR SPETZLER 8 1.0 (NEUROSURGERY SUPPLIES) ×3 IMPLANT
GAUZE SPONGE 4X4 12PLY STRL LF (GAUZE/BANDAGES/DRESSINGS) IMPLANT
GAUZE SPONGE 4X4 16PLY XRAY LF (GAUZE/BANDAGES/DRESSINGS) ×6 IMPLANT
GLOVE BIO SURGEON STRL SZ 6.5 (GLOVE) IMPLANT
GLOVE BIO SURGEON STRL SZ7.5 (GLOVE) ×3 IMPLANT
GLOVE BIO SURGEONS STRL SZ 6.5 (GLOVE)
GLOVE BIOGEL PI IND STRL 7.0 (GLOVE) ×2 IMPLANT
GLOVE BIOGEL PI INDICATOR 7.0 (GLOVE) ×4
GLOVE ECLIPSE 6.5 STRL STRAW (GLOVE) ×9 IMPLANT
GOWN STRL REUS W/ TWL LRG LVL3 (GOWN DISPOSABLE) ×2 IMPLANT
GOWN STRL REUS W/ TWL XL LVL3 (GOWN DISPOSABLE) ×1 IMPLANT
GOWN STRL REUS W/TWL LRG LVL3 (GOWN DISPOSABLE) ×4
GOWN STRL REUS W/TWL XL LVL3 (GOWN DISPOSABLE) ×2
HEMOSTAT SURGICEL 2X14 (HEMOSTASIS) IMPLANT
NEEDLE HYPO 25X1 1.5 SAFETY (NEEDLE) ×3 IMPLANT
NS IRRIG 1000ML POUR BTL (IV SOLUTION) ×3 IMPLANT
PACK BASIN DAY SURGERY FS (CUSTOM PROCEDURE TRAY) ×3 IMPLANT
PENCIL BUTTON HOLSTER BLD 10FT (ELECTRODE) ×3 IMPLANT
PIN SAFETY STERILE (MISCELLANEOUS) IMPLANT
PROBE NERVBE PRASS .33 (MISCELLANEOUS) ×3 IMPLANT
SHEARS HARMONIC 9CM CVD (BLADE) ×3 IMPLANT
SLEEVE SCD COMPRESS KNEE MED (MISCELLANEOUS) ×3 IMPLANT
SPONGE INTESTINAL PEANUT (DISPOSABLE) ×3 IMPLANT
STAPLER VISISTAT 35W (STAPLE) IMPLANT
SUT ETHILON 3 0 PS 1 (SUTURE) ×3 IMPLANT
SUT PROLENE 5 0 P 3 (SUTURE) IMPLANT
SUT SILK 2 0 SH (SUTURE) ×3 IMPLANT
SUT SILK 2 0 TIES 17X18 (SUTURE) ×2
SUT SILK 2-0 18XBRD TIE BLK (SUTURE) ×1 IMPLANT
SUT SILK 3 0 TIES 17X18 (SUTURE) ×4
SUT SILK 3-0 18XBRD TIE BLK (SUTURE) ×2 IMPLANT
SUT VIC AB 3-0 FS2 27 (SUTURE) ×3 IMPLANT
SUT VICRYL 4-0 PS2 18IN ABS (SUTURE) ×3 IMPLANT
SYR BULB 3OZ (MISCELLANEOUS) ×3 IMPLANT
SYR CONTROL 10ML LL (SYRINGE) ×3 IMPLANT
TOWEL OR 17X24 6PK STRL BLUE (TOWEL DISPOSABLE) ×6 IMPLANT
TRAY DSU PREP LF (CUSTOM PROCEDURE TRAY) ×3 IMPLANT
TUBE CONNECTING 20'X1/4 (TUBING) ×1
TUBE CONNECTING 20X1/4 (TUBING) ×2 IMPLANT
TUBE ENDOTRAC NIMS EMG 6MM (MISCELLANEOUS) IMPLANT
TUBE ENDOTRAC NIMS EMG 7MM (MISCELLANEOUS) IMPLANT
TUBE ENDOTRAC NIMS EMG 8MM (MISCELLANEOUS) ×3 IMPLANT
TUBE ENDOTRAC NIMS EMG 9MM (MISCELLANEOUS) IMPLANT

## 2016-08-19 NOTE — Anesthesia Procedure Notes (Signed)
Procedure Name: Intubation Date/Time: 08/19/2016 8:35 AM Performed by: Catalina Gravel Pre-anesthesia Checklist: Patient identified, Emergency Drugs available, Suction available and Patient being monitored Patient Re-evaluated:Patient Re-evaluated prior to inductionOxygen Delivery Method: Circle system utilized Preoxygenation: Pre-oxygenation with 100% oxygen Intubation Type: IV induction Ventilation: Mask ventilation without difficulty Laryngoscope Size: Mac and 3 Grade View: Grade II Tube type: Oral Tube size: 7.0 mm Number of attempts: 2 Placement Confirmation: ETT inserted through vocal cords under direct vision,  positive ETCO2 and breath sounds checked- equal and bilateral Secured at: 21 cm Tube secured with: Tape Dental Injury: Teeth and Oropharynx as per pre-operative assessment  Difficulty Due To: Difficulty was anticipated and Difficult Airway- due to dentition

## 2016-08-19 NOTE — Transfer of Care (Signed)
Immediate Anesthesia Transfer of Care Note  Patient: Kristin Barnes  Procedure(s) Performed: Procedure(s): RIGHT HEMI-THYROIDECTOMY (Right)  Patient Location: PACU  Anesthesia Type:General  Level of Consciousness: awake  Airway & Oxygen Therapy: Patient Spontanous Breathing and Patient connected to face mask oxygen  Post-op Assessment: Report given to RN and Post -op Vital signs reviewed and stable  Post vital signs: Reviewed and stable  Last Vitals:  Vitals:   08/19/16 0737  BP: (!) 159/54  Pulse: (!) 54  Resp: 20  Temp: 36.4 C    Last Pain:  Vitals:   08/19/16 0737  TempSrc: Oral         Complications: No apparent anesthesia complications

## 2016-08-19 NOTE — Anesthesia Postprocedure Evaluation (Signed)
Anesthesia Post Note  Patient: Gerardine Peltz  Procedure(s) Performed: Procedure(s) (LRB): RIGHT HEMI-THYROIDECTOMY (Right)     Patient location during evaluation: PACU Anesthesia Type: General Level of consciousness: awake and alert Pain management: pain level controlled Vital Signs Assessment: post-procedure vital signs reviewed and stable Respiratory status: spontaneous breathing, nonlabored ventilation, respiratory function stable and patient connected to nasal cannula oxygen Cardiovascular status: blood pressure returned to baseline and stable Postop Assessment: no signs of nausea or vomiting Anesthetic complications: no    Last Vitals:  Vitals:   08/19/16 1141 08/19/16 1221  BP:  (!) 172/72  Pulse: 73 65  Resp: 14 16  Temp:  36.4 C    Last Pain:  Vitals:   08/19/16 1221  TempSrc:   PainSc: [place]

## 2016-08-19 NOTE — H&P (Signed)
Cc: Thyroid nodules, neck compression  HPI: Kristin patient is a 76 y/o female who presents today for evaluation of thyroid nodules. Kristin patient is seen in consultation requested by Dr. Jerene Bears. Kristin patient noted onset of throat discomfort, dysphagia, and occasional difficulty breathing earlier this year. She underwent a neck ultrasound which showed a large right thyroid nodule and a small nodule at Kristin thyroid isthmus. Kristin patient has occasional episodes of choking. She has not undergone a thyroid biopsy. Previous ENT surgery is denied.   Kristin patient's review of systems (constitutional, eyes, ENT, cardiovascular, respiratory, GI, musculoskeletal, skin, neurologic, psychiatric, endocrine, hematologic, allergic) is noted in Kristin ROS questionnaire.  It is reviewed with Kristin patient.   Family health history: Heart disease.  Major events: Gallstones removed, tubal ligation.  Ongoing medical problems: Cataracts, diabetes, chest pain, reflux, thyroid disease, hypertension.  Social history: Kristin patient is married. She denies Kristin use of tobacco , alcohol or illegal drugs.  Exam General: Communicates without difficulty, well nourished, no acute distress. Head: Normocephalic, no evidence injury, no tenderness, facial buttresses intact without stepoff. Eyes: PERRL, EOMI.  No scleral icterus, conjunctivae clear. Ears: External auditory canals clear bilaterally.  There is no edema or erythema.  Tympanic membrane is within normal limits bilaterally. Nose: Normal skin and external support.  Anterior rhinoscopy reveals healthy pink mucosa over Kristin septum and turbinates.  No lesions or polyps were seen. Oral cavity: Lips without lesions, oral mucosa moist, no masses or lesions seen. Indirect  mirror laryngoscopy could not be tolerated. Pharynx: Clear, no erythema. Neck: Supple, full range of motion, no lymphadenopathy, no masses palpable. Salivary: Parotid and submandibular glands without mass. Right thyroid enlarged.  Neuro:  CN 2-12 grossly intact. Gait normal. Vestibular: No nystagmus at any point of gaze.   Procedure:  Flexible Fiberoptic Laryngoscopy -- Risks, benefits, and alternatives of flexible endoscopy were explained to Kristin patient.  Specific mention was made of Kristin risk of throat numbness with difficulty swallowing, possible bleeding from Kristin nose and mouth, and pain from Kristin procedure.  Kristin patient gave oral consent to proceed.  Kristin nasal cavities were decongested and anesthetised with a combination of oxymetazoline and 4% lidocaine solution.  Kristin flexible scope was inserted into Kristin right nasal cavity and advanced towards Kristin nasopharynx.  Visualized mucosa over Kristin turbinates and septum were as described above.  Kristin nasopharynx was clear.  Oropharyngeal walls were symmetric and mobile without lesion, mass, or edema.  Hypopharynx was also without  lesion or edema.  Larynx was mobile without lesions. Supraglottic structures were free of edema, mass, and asymmetry.  True vocal folds were mobile and without mass or lesion.  Base of tongue was within normal limits.  Kristin patient tolerated Kristin procedure well.   Assessment 1.  Multinodular thyroid goiter with a dominant right lobe nodule measuring 4.6 cm and an isthmus nodule measuring 2.2 cm. 2.  Kristin patient is currently experiencing compressive symptoms.   No other suspicious mass or lesion is noted on today's fiberoptic laryngoscopy exam. Vocal cords are mobile bilaterally.   Plan  1.  Kristin physical exam, ultrasound, and laryngoscopy findings are reviewed with Kristin patient.  2.  Treatment options include FNA, observation, or right hemithyroidectomy. In light of Kristin patient's compressive symptoms, she will likely benefit from right hemithyroidectomy. Kristin risks, benefits, alternatives, and details of Kristin procedure are reviewed with Kristin patient. Questions are invited and answered. 3. Kristin patient is interested in proceeding with Kristin procedure.  We will schedule  Kristin  procedure in accordance with Kristin family schedule.

## 2016-08-19 NOTE — Discharge Instructions (Signed)
Thyroidectomy, Care After °Refer to this sheet in the next few weeks. These instructions provide you with information about caring for yourself after your procedure. Your health care provider may also give you more specific instructions. Your treatment has been planned according to current medical practices, but problems sometimes occur. Call your health care provider if you have any problems or questions after your procedure. °What can I expect after the procedure? °After your procedure, it is typical to have: °· Mild pain in the neck or upper body, especially when swallowing. °· A sore throat. °· A weak voice. ° °Follow these instructions at home: °· Take medicines only as directed by your health care provider. °· If your entire thyroid gland was removed, you may need to take thyroid hormone medicine from now on. °· Do not take medicines that contain aspirin and ibuprofen until your health care provider says that you can. These medicines can increase your risk of bleeding. °· Some pain medicines cause constipation. Drink enough fluid to keep your urine clear or pale yellow. This can help to prevent constipation. °· Start slowly with eating. You may need to have only liquids and soft foods for a few days or as directed by your health care provider. °· Resume your usual activities as directed by your health care provider. °· For the first 10 days after the procedure or as instructed by your health care provider: °? Do not lift anything heavier than 20 lb (9.1 kg). °? Do not jog, swim, or do other strenuous exercises. °? Do not play contact sports. °· Keep all follow-up visits as directed by your health care provider. This is important. °Contact a health care provider if: °· The soreness in your throat gets worse. °· You have increased pain at your incision or incisions. °· You have increased bleeding from an incision. °· Your incision becomes infected. Watch for: °? Swelling. °? Redness. °? Warmth. °? Pus. °· You  notice a bad smell coming from an incision or dressing. °· You have a fever. °· You feel lightheaded or faint. °· You have numbness, tingling, or muscle spasms in your: °? Arms. °? Hands. °? Feet. °? Face. °· You have trouble swallowing. °Get help right away if: °· You develop a rash. °· You have difficulty breathing. °· You hear whistling noises coming from your chest. °· You develop a cough that gets worse. °· Your speech changes, or you have hoarseness that gets worse. °This information is not intended to replace advice given to you by your health care provider. Make sure you discuss any questions you have with your health care provider. °Document Released: 09/21/2004 Document Revised: 11/05/2015 Document Reviewed: 08/04/2013 °Elsevier Interactive Patient Education © 2018 [employer] ° °

## 2016-08-19 NOTE — Op Note (Signed)
DATE OF PROCEDURE:  08/19/2016                              OPERATIVE REPORT  SURGEON:  Leta Baptist, MD  PREOPERATIVE DIAGNOSES: 1. Right thyroid nodules. 2. Dysphagia.  POSTOPERATIVE DIAGNOSES: 1. Right thyroid nodules. 2. Dysphagia.  PROCEDURE PERFORMED:  Right total thyroid lobectomy.  ANESTHESIA:  General endotracheal tube anesthesia.  COMPLICATIONS:  None.  ESTIMATED BLOOD LOSS:  144ml.  INDICATION FOR PROCEDURE:  Kristin Barnes is a 76 y.o. female with a history of progressive dysphagia and occasional dyspnea. She underwent a neck ultrasound which showed a large right thyroid nodule (4.6cm) and a small nodule at the thyroid isthmus. Her symptoms were likely a result of her thyroid compression. Based on the above findings, the decision was made for the patient to undergo the hemithyroidectomy procedure. Likelihood of success in reducing symptoms was also discussed.  The risks, benefits, alternatives, and details of the procedure were discussed with the patient.  Questions were invited and answered.  Informed consent was obtained.  DESCRIPTION:  The patient was taken to the operating room and placed supine on the operating table.  General endotracheal tube anesthesia was administered by the anesthesiologist.  The patient was positioned and prepped and draped in a standard fashion for thyroidectomy surgery. The NIMS endotracheal tube was used. The nerve monitoring system was functional throughout the case.  1% lidocaine with 1-100,000 epinephrine was infiltrated at the planned site of incision. A lower neck transverse incision was made in a standard fashion. The incision was carried down to the level of the platysma muscles. Subplatysmal flaps were elevated. The strap muscles were divided at midline and retracted laterally, exposing the thyroid gland. The patient was noted to have a large right thyroid nodule and a smaller nodule within the isthmus. The right thyroid lobe was carefully dissected  free from the surrounding soft tissue. The external branch of the right superior laryngeal nerve and the right recurrent laryngeal nerve were identified and preserved. Both nerves were noted to be functional at the end of the case.  The entire right thyroid lobe and isthmus were then resected free and sent to the pathology department for permanent histologic identification. The surgical site was copiously irrigated. A #10 JP drain was placed. The incision was closed in layers with 4-0 Vicryl sutures and Dermabond.  The care of the patient was turned over to the anesthesiologist.  The patient was awakened from anesthesia without difficulty.  The patient was extubated and transferred to the recovery room in good condition.  OPERATIVE FINDINGS:  Right thyroid and isthmus nodules.   SPECIMEN:  Right hemithyroidectomy specimen.   FOLLOWUP CARE:  The patient will be observed overnight.   Kristin Barnes 08/19/2016 10:48 AM

## 2016-08-19 NOTE — Anesthesia Preprocedure Evaluation (Addendum)
Anesthesia Evaluation  Patient identified by MRN, date of birth, ID band Patient awake    Reviewed: Allergy & Precautions, NPO status , Patient's Chart, lab work & pertinent test results  Airway Mallampati: II  TM Distance: <3 FB Neck ROM: Full   Comment: Prominent upper front teeth Dental  (+) Teeth Intact, Dental Advisory Given   Pulmonary sleep apnea and Continuous Positive Airway Pressure Ventilation ,    Pulmonary exam normal breath sounds clear to auscultation       Cardiovascular hypertension, Pt. on medications Normal cardiovascular exam Rhythm:Regular Rate:Normal     Neuro/Psych negative neurological ROS  negative psych ROS   GI/Hepatic Neg liver ROS, GERD  ,  Endo/Other  Morbid obesityRight thyroid nodules   Renal/GU negative Renal ROS     Musculoskeletal  (+) Arthritis ,   Abdominal   Peds  Hematology negative hematology ROS (+)   Anesthesia Other Findings Day of surgery medications reviewed with the patient.  Reproductive/Obstetrics                            Anesthesia Physical Anesthesia Plan  ASA: III  Anesthesia Plan: General   Post-op Pain Management:    Induction: Intravenous  Airway Management Planned: Oral ETT  Additional Equipment:   Intra-op Plan:   Post-operative Plan: Extubation in OR  Informed Consent: I have reviewed the patients History and Physical, chart, labs and discussed the procedure including the risks, benefits and alternatives for the proposed anesthesia with the patient or authorized representative who has indicated his/her understanding and acceptance.   Dental advisory given  Plan Discussed with: CRNA  Anesthesia Plan Comments: (Risks/benefits of general anesthesia discussed with patient including risk of damage to teeth, lips, gum, and tongue, nausea/vomiting, allergic reactions to medications, and the possibility of heart attack,  stroke and death.  All patient questions answered.  Patient wishes to proceed.)        Anesthesia Quick Evaluation

## 2016-08-20 ENCOUNTER — Encounter (HOSPITAL_BASED_OUTPATIENT_CLINIC_OR_DEPARTMENT_OTHER): Payer: Self-pay | Admitting: Otolaryngology

## 2016-08-20 DIAGNOSIS — E7800 Pure hypercholesterolemia, unspecified: Secondary | ICD-10-CM | POA: Diagnosis not present

## 2016-08-20 DIAGNOSIS — I10 Essential (primary) hypertension: Secondary | ICD-10-CM | POA: Diagnosis not present

## 2016-08-20 NOTE — Discharge Summary (Signed)
Physician Discharge Summary  Patient ID: Kristin Barnes MRN: [id number] DOB/AGE: [birth year] 76 y.o.  Admit date: 08/19/2016 Discharge date: 08/20/2016  Admission Diagnoses: Right thyroid mass  Discharge Diagnoses: Right thyroid mass Active Problems:   S/P partial thyroidectomy  Discharged Condition: good  Hospital Course: Pt had an uneventful overnight stay. Pt tolerated po well. No bleeding. No stridor. Voice is strong.  Consults: None  Significant Diagnostic Studies: None  Treatments: surgery: Right hemithyroidectomy  Discharge Exam: Blood pressure (!) 103/59, pulse (!) 54, temperature 97.4 F (36.3 C), resp. rate 16, height 5\' 2"  (1.575 m), weight 228 lb (103.4 kg), SpO2 98 %. Incision/Wound:c/d/i Voice is strong  Disposition: Final discharge disposition not confirmed  Discharge Instructions    Activity as tolerated - No restrictions    Complete by:  As directed    Diet general    Complete by:  As directed      Allergies as of 08/20/2016   No Known Allergies     Medication List    TAKE these medications   amoxicillin 875 MG tablet Commonly known as:  AMOXIL Take 1 tablet (875 mg total) by mouth 2 (two) times daily.   atorvastatin 10 MG tablet Commonly known as:  LIPITOR Take 10 mg by mouth daily.   losartan 25 MG tablet Commonly known as:  COZAAR Take 25 mg by mouth daily.   oxyCODONE-acetaminophen 5-325 MG tablet Commonly known as:  ROXICET Take 1-2 tablets by mouth every 4 (four) hours as needed for severe pain.      Follow-up Information    Leta Baptist, MD Follow up on 08/26/2016.   Specialty:  Otolaryngology Why:  at 4:10pm Contact information: [address] [place] [address] [phone]           Signed: Burley Saver 08/20/2016, 8:55 AM

## 2016-08-26 ENCOUNTER — Ambulatory Visit (INDEPENDENT_AMBULATORY_CARE_PROVIDER_SITE_OTHER): Payer: Medicare Other | Admitting: Otolaryngology

## 2016-08-28 DIAGNOSIS — I803 Phlebitis and thrombophlebitis of lower extremities, unspecified: Secondary | ICD-10-CM | POA: Diagnosis not present

## 2016-08-28 DIAGNOSIS — I10 Essential (primary) hypertension: Secondary | ICD-10-CM | POA: Diagnosis not present

## 2016-08-28 DIAGNOSIS — Z299 Encounter for prophylactic measures, unspecified: Secondary | ICD-10-CM | POA: Diagnosis not present

## 2016-08-28 DIAGNOSIS — E7800 Pure hypercholesterolemia, unspecified: Secondary | ICD-10-CM | POA: Diagnosis not present

## 2016-08-28 DIAGNOSIS — Z6841 Body Mass Index (BMI) 40.0 and over, adult: Secondary | ICD-10-CM | POA: Diagnosis not present

## 2016-08-28 DIAGNOSIS — E041 Nontoxic single thyroid nodule: Secondary | ICD-10-CM | POA: Diagnosis not present

## 2016-10-07 DIAGNOSIS — L219 Seborrheic dermatitis, unspecified: Secondary | ICD-10-CM | POA: Diagnosis not present

## 2016-10-15 DIAGNOSIS — H35033 Hypertensive retinopathy, bilateral: Secondary | ICD-10-CM | POA: Diagnosis not present

## 2016-10-16 DIAGNOSIS — E7800 Pure hypercholesterolemia, unspecified: Secondary | ICD-10-CM | POA: Diagnosis not present

## 2016-10-16 DIAGNOSIS — I10 Essential (primary) hypertension: Secondary | ICD-10-CM | POA: Diagnosis not present

## 2016-10-29 DIAGNOSIS — Z6841 Body Mass Index (BMI) 40.0 and over, adult: Secondary | ICD-10-CM | POA: Diagnosis not present

## 2016-10-29 DIAGNOSIS — E7800 Pure hypercholesterolemia, unspecified: Secondary | ICD-10-CM | POA: Diagnosis not present

## 2016-10-29 DIAGNOSIS — Z299 Encounter for prophylactic measures, unspecified: Secondary | ICD-10-CM | POA: Diagnosis not present

## 2016-10-29 DIAGNOSIS — E041 Nontoxic single thyroid nodule: Secondary | ICD-10-CM | POA: Diagnosis not present

## 2016-10-29 DIAGNOSIS — K5730 Diverticulosis of large intestine without perforation or abscess without bleeding: Secondary | ICD-10-CM | POA: Diagnosis not present

## 2016-12-05 DIAGNOSIS — I10 Essential (primary) hypertension: Secondary | ICD-10-CM | POA: Diagnosis not present

## 2016-12-05 DIAGNOSIS — E7800 Pure hypercholesterolemia, unspecified: Secondary | ICD-10-CM | POA: Diagnosis not present

## 2016-12-27 DIAGNOSIS — Z23 Encounter for immunization: Secondary | ICD-10-CM | POA: Diagnosis not present

## 2016-12-31 DIAGNOSIS — Z6841 Body Mass Index (BMI) 40.0 and over, adult: Secondary | ICD-10-CM | POA: Diagnosis not present

## 2016-12-31 DIAGNOSIS — Z299 Encounter for prophylactic measures, unspecified: Secondary | ICD-10-CM | POA: Diagnosis not present

## 2016-12-31 DIAGNOSIS — K5730 Diverticulosis of large intestine without perforation or abscess without bleeding: Secondary | ICD-10-CM | POA: Diagnosis not present

## 2016-12-31 DIAGNOSIS — I10 Essential (primary) hypertension: Secondary | ICD-10-CM | POA: Diagnosis not present

## 2016-12-31 DIAGNOSIS — E041 Nontoxic single thyroid nodule: Secondary | ICD-10-CM | POA: Diagnosis not present

## 2017-01-13 DIAGNOSIS — E7800 Pure hypercholesterolemia, unspecified: Secondary | ICD-10-CM | POA: Diagnosis not present

## 2017-01-13 DIAGNOSIS — I10 Essential (primary) hypertension: Secondary | ICD-10-CM | POA: Diagnosis not present

## 2017-02-14 DIAGNOSIS — I10 Essential (primary) hypertension: Secondary | ICD-10-CM | POA: Diagnosis not present

## 2017-02-14 DIAGNOSIS — E7800 Pure hypercholesterolemia, unspecified: Secondary | ICD-10-CM | POA: Diagnosis not present

## 2017-02-17 DIAGNOSIS — E7800 Pure hypercholesterolemia, unspecified: Secondary | ICD-10-CM | POA: Diagnosis not present

## 2017-02-17 DIAGNOSIS — I10 Essential (primary) hypertension: Secondary | ICD-10-CM | POA: Diagnosis not present

## 2017-04-03 DIAGNOSIS — E7800 Pure hypercholesterolemia, unspecified: Secondary | ICD-10-CM | POA: Diagnosis not present

## 2017-04-03 DIAGNOSIS — E041 Nontoxic single thyroid nodule: Secondary | ICD-10-CM | POA: Diagnosis not present

## 2017-04-03 DIAGNOSIS — J069 Acute upper respiratory infection, unspecified: Secondary | ICD-10-CM | POA: Diagnosis not present

## 2017-04-03 DIAGNOSIS — K5730 Diverticulosis of large intestine without perforation or abscess without bleeding: Secondary | ICD-10-CM | POA: Diagnosis not present

## 2017-04-03 DIAGNOSIS — Z299 Encounter for prophylactic measures, unspecified: Secondary | ICD-10-CM | POA: Diagnosis not present

## 2017-04-03 DIAGNOSIS — I10 Essential (primary) hypertension: Secondary | ICD-10-CM | POA: Diagnosis not present

## 2017-04-03 DIAGNOSIS — Z6841 Body Mass Index (BMI) 40.0 and over, adult: Secondary | ICD-10-CM | POA: Diagnosis not present

## 2017-04-07 DIAGNOSIS — I10 Essential (primary) hypertension: Secondary | ICD-10-CM | POA: Diagnosis not present

## 2017-04-07 DIAGNOSIS — E7800 Pure hypercholesterolemia, unspecified: Secondary | ICD-10-CM | POA: Diagnosis not present

## 2017-04-28 DIAGNOSIS — E041 Nontoxic single thyroid nodule: Secondary | ICD-10-CM | POA: Diagnosis not present

## 2017-04-28 DIAGNOSIS — Z299 Encounter for prophylactic measures, unspecified: Secondary | ICD-10-CM | POA: Diagnosis not present

## 2017-04-28 DIAGNOSIS — Z1211 Encounter for screening for malignant neoplasm of colon: Secondary | ICD-10-CM | POA: Diagnosis not present

## 2017-04-28 DIAGNOSIS — R5383 Other fatigue: Secondary | ICD-10-CM | POA: Diagnosis not present

## 2017-04-28 DIAGNOSIS — Z0000 Encounter for general adult medical examination without abnormal findings: Secondary | ICD-10-CM | POA: Diagnosis not present

## 2017-04-28 DIAGNOSIS — Z1331 Encounter for screening for depression: Secondary | ICD-10-CM | POA: Diagnosis not present

## 2017-04-28 DIAGNOSIS — Z1339 Encounter for screening examination for other mental health and behavioral disorders: Secondary | ICD-10-CM | POA: Diagnosis not present

## 2017-04-28 DIAGNOSIS — Z6841 Body Mass Index (BMI) 40.0 and over, adult: Secondary | ICD-10-CM | POA: Diagnosis not present

## 2017-04-28 DIAGNOSIS — Z79899 Other long term (current) drug therapy: Secondary | ICD-10-CM | POA: Diagnosis not present

## 2017-04-28 DIAGNOSIS — Z7189 Other specified counseling: Secondary | ICD-10-CM | POA: Diagnosis not present

## 2017-04-28 DIAGNOSIS — I10 Essential (primary) hypertension: Secondary | ICD-10-CM | POA: Diagnosis not present

## 2017-05-15 DIAGNOSIS — I10 Essential (primary) hypertension: Secondary | ICD-10-CM | POA: Diagnosis not present

## 2017-05-15 DIAGNOSIS — E7800 Pure hypercholesterolemia, unspecified: Secondary | ICD-10-CM | POA: Diagnosis not present

## 2017-05-19 DIAGNOSIS — Z6841 Body Mass Index (BMI) 40.0 and over, adult: Secondary | ICD-10-CM | POA: Diagnosis not present

## 2017-05-19 DIAGNOSIS — N390 Urinary tract infection, site not specified: Secondary | ICD-10-CM | POA: Diagnosis not present

## 2017-05-19 DIAGNOSIS — R319 Hematuria, unspecified: Secondary | ICD-10-CM | POA: Diagnosis not present

## 2017-05-19 DIAGNOSIS — Z713 Dietary counseling and surveillance: Secondary | ICD-10-CM | POA: Diagnosis not present

## 2017-05-19 DIAGNOSIS — Z299 Encounter for prophylactic measures, unspecified: Secondary | ICD-10-CM | POA: Diagnosis not present

## 2017-05-30 ENCOUNTER — Encounter (HOSPITAL_COMMUNITY): Payer: Self-pay

## 2017-05-30 ENCOUNTER — Other Ambulatory Visit: Payer: Self-pay

## 2017-05-30 ENCOUNTER — Emergency Department (HOSPITAL_COMMUNITY)
Admission: EM | Admit: 2017-05-30 | Discharge: 2017-05-31 | Disposition: A | Discharge status: Home / Self Care | Payer: Medicare Other | Attending: Emergency Medicine | Admitting: Emergency Medicine

## 2017-05-30 ENCOUNTER — Emergency Department (HOSPITAL_COMMUNITY): Payer: Medicare Other

## 2017-05-30 DIAGNOSIS — R0602 Shortness of breath: Secondary | ICD-10-CM | POA: Insufficient documentation

## 2017-05-30 DIAGNOSIS — R1013 Epigastric pain: Secondary | ICD-10-CM | POA: Diagnosis not present

## 2017-05-30 DIAGNOSIS — R1011 Right upper quadrant pain: Secondary | ICD-10-CM | POA: Insufficient documentation

## 2017-05-30 DIAGNOSIS — R110 Nausea: Secondary | ICD-10-CM | POA: Diagnosis not present

## 2017-05-30 DIAGNOSIS — R112 Nausea with vomiting, unspecified: Secondary | ICD-10-CM | POA: Diagnosis not present

## 2017-05-30 DIAGNOSIS — I10 Essential (primary) hypertension: Secondary | ICD-10-CM | POA: Diagnosis not present

## 2017-05-30 DIAGNOSIS — R0789 Other chest pain: Secondary | ICD-10-CM | POA: Insufficient documentation

## 2017-05-30 DIAGNOSIS — K449 Diaphragmatic hernia without obstruction or gangrene: Secondary | ICD-10-CM | POA: Diagnosis not present

## 2017-05-30 DIAGNOSIS — R079 Chest pain, unspecified: Secondary | ICD-10-CM | POA: Diagnosis not present

## 2017-05-30 LAB — BASIC METABOLIC PANEL
Anion gap: 8 (ref 5–15)
BUN: 14 mg/dL (ref 6–20)
CHLORIDE: 102 mmol/L (ref 101–111)
CO2: 26 mmol/L (ref 22–32)
CREATININE: 0.79 mg/dL (ref 0.44–1.00)
Calcium: 9.1 mg/dL (ref 8.9–10.3)
GFR calc non Af Amer: >60 mL/min (ref >60)
Glucose, Bld: 100 mg/dL — ABNORMAL HIGH (ref 65–99)
POTASSIUM: 4.4 mmol/L (ref 3.5–5.1)
Sodium: 136 mmol/L (ref 135–145)

## 2017-05-30 LAB — CBC
HEMATOCRIT: 36.4 % (ref 36.0–46.0)
Hemoglobin: 11.4 g/dL — ABNORMAL LOW (ref 12.0–15.0)
MCH: 28.8 pg (ref 26.0–34.0)
MCHC: 31.3 g/dL (ref 30.0–36.0)
MCV: 91.9 fL (ref 78.0–100.0)
Platelets: 221 10*3/uL (ref 150–400)
RBC: 3.96 MIL/uL (ref 3.87–5.11)
RDW: 14.2 % (ref 11.5–15.5)
WBC: 5.5 10*3/uL (ref 4.0–10.5)

## 2017-05-30 LAB — I-STAT TROPONIN, ED: Troponin i, poc: 0 ng/mL (ref 0.00–0.08)

## 2017-05-30 MED ORDER — GI COCKTAIL ~~LOC~~
30.0000 mL | Freq: Once | ORAL | Status: AC
  Administered 2017-05-31: 30 mL via ORAL
  Filled 2017-05-30: qty 30

## 2017-05-30 MED ORDER — ONDANSETRON HCL 4 MG/2ML IJ SOLN
4.0000 mg | Freq: Once | INTRAMUSCULAR | Status: AC
Start: 2017-05-31 — End: 2017-05-31
  Administered 2017-05-31: 4 mg via INTRAVENOUS
  Filled 2017-05-30: qty 2

## 2017-05-30 NOTE — ED Notes (Signed)
Patient here for chest pain that sits in in the center of her chest, no radiation and no shortness of breath.  Denies any fevers or chills.  Patient does report being tired more for the last few days.

## 2017-05-31 ENCOUNTER — Emergency Department (HOSPITAL_COMMUNITY): Payer: Medicare Other

## 2017-05-31 DIAGNOSIS — K449 Diaphragmatic hernia without obstruction or gangrene: Secondary | ICD-10-CM | POA: Diagnosis not present

## 2017-05-31 DIAGNOSIS — R0789 Other chest pain: Secondary | ICD-10-CM | POA: Diagnosis not present

## 2017-05-31 LAB — HEPATIC FUNCTION PANEL
ALBUMIN: 3 g/dL — AB (ref 3.5–5.0)
ALT: 14 U/L (ref 14–54)
AST: 20 U/L (ref 15–41)
Alkaline Phosphatase: 96 U/L (ref 38–126)
Bilirubin, Direct: <0.1 mg/dL — ABNORMAL LOW (ref 0.1–0.5)
TOTAL PROTEIN: 7 g/dL (ref 6.5–8.1)
Total Bilirubin: 0.5 mg/dL (ref 0.3–1.2)

## 2017-05-31 LAB — I-STAT TROPONIN, ED: TROPONIN I, POC: 0 ng/mL (ref 0.00–0.08)

## 2017-05-31 LAB — LIPASE, BLOOD: Lipase: 24 U/L (ref 11–51)

## 2017-05-31 MED ORDER — PANTOPRAZOLE SODIUM 40 MG IV SOLR
40.0000 mg | Freq: Once | INTRAVENOUS | Status: AC
  Administered 2017-05-31: 40 mg via INTRAVENOUS
  Filled 2017-05-31: qty 40

## 2017-05-31 MED ORDER — OMEPRAZOLE 20 MG PO CPDR: 20.0000 mg | DELAYED_RELEASE_CAPSULE | Freq: Every day | ORAL | 0 refills | Status: DC

## 2017-05-31 MED ORDER — IOPAMIDOL (ISOVUE-300) INJECTION 61%
INTRAVENOUS | Status: AC
  Administered 2017-05-31: 100 mL
  Filled 2017-05-31: qty 100

## 2017-05-31 NOTE — ED Notes (Signed)
ED Provider at bedside. 

## 2017-05-31 NOTE — Discharge Instructions (Signed)
There is no evidence of heart attack.  Follow-up with the cardiologist for stress test.  Take the stomach medication as prescribed.  Avoid medications such as NSAIDs, spicy foods, alcohol and caffeine.  Return to the ED if your chest pain becomes exertional, associated with shortness of breath or sweating or any other concerns.

## 2017-05-31 NOTE — ED Provider Notes (Signed)
[facility] EMERGENCY DEPARTMENT Provider Note   CSN: [id number] Arrival date & time: 05/30/17  1939     History   Chief Complaint Chief Complaint  Patient presents with  . Chest Pain    HPI Kristin Barnes is a 77 y.o. female.  Patient with history of hypertension, hyperlipidemia, diet-controlled diabetes, acid reflux disease presenting with central chest pain.  States she woke up around 7 AM with pain in the center of her chest that radiated to her upper back.  It was associated with shortness of breath and nausea.  Reports pain was constant all day.  She had one episode of vomiting just before arriving to the ED around 8 PM and this relieved the pain.  She feels better at this time but has ongoing epigastric and right upper quadrant pain.  She states she has had this chest pain yesterday that lasted for several hours as well as well as an episode last week that lasted several hours.  She is never had this chest pain evaluated.  Denies any cardiac history.  She is never had a stress test.  She reports ongoing epigastric and right upper quadrant pain now.  She does not know if she still has a gallbladder.  She denies any cough, fever, chills.  No pain with urination or blood in the urine.  No diarrhea.   The history is provided by the patient and a relative.  Chest Pain   Associated symptoms include abdominal pain, nausea, shortness of breath and vomiting. Pertinent negatives include no dizziness, no fever, no headaches and no weakness.    Past Medical History:  Diagnosis Date  . Arthritis    hands and finger  . GERD (gastroesophageal reflux disease)    uses club soda  . Hyperlipidemia   . Hypertension   . Sleep apnea    states she had a CPAP machine at one time but was picked up...over 44yrs ago    Patient Active Problem List   Diagnosis Date Noted  . S/P partial thyroidectomy 08/19/2016    Past Surgical History:  Procedure Laterality Date  .  CHOLECYSTECTOMY    . THYROIDECTOMY Right 08/19/2016   Procedure: RIGHT HEMI-THYROIDECTOMY;  Surgeon: Leta Baptist, MD;  Location: [facility];  Service: ENT;  Laterality: Right;  . TUBAL LIGATION      OB History    No data available       Home Medications    Prior to Admission medications   Medication Sig Start Date End Date Taking? Authorizing Provider  amoxicillin (AMOXIL) 875 MG tablet Take 1 tablet (875 mg total) by mouth 2 (two) times daily. 08/19/16   Leta Baptist, MD  atorvastatin (LIPITOR) 10 MG tablet Take 10 mg by mouth daily.    [provider]  losartan (COZAAR) 25 MG tablet Take 25 mg by mouth daily.    [provider]  oxyCODONE-acetaminophen (ROXICET) 5-325 MG tablet Take 1-2 tablets by mouth every 4 (four) hours as needed for severe pain. 08/19/16   Leta Baptist, MD    Family History History reviewed. No pertinent family history.  Social History Social History   Tobacco Use  . Smoking status: Never Smoker  . Smokeless tobacco: Never Used  Substance Use Topics  . Alcohol use: No  . Drug use: No     Allergies   Patient has no known allergies.   Review of Systems Review of Systems  Constitutional: Negative for activity change, appetite change and fever.  HENT: Negative for congestion and rhinorrhea.   Respiratory: Positive for chest tightness and shortness of breath.   Cardiovascular: Positive for chest pain.  Gastrointestinal: Positive for abdominal pain, nausea and vomiting.  Genitourinary: Negative for dysuria, hematuria, vaginal bleeding and vaginal discharge.  Musculoskeletal: Negative for arthralgias and gait problem.  Skin: Negative for rash.  Neurological: Negative for dizziness, weakness and headaches.   all other systems are negative except as noted in the HPI and PMH.     Physical Exam Updated Vital Signs BP (!) 148/66   Pulse 95   Temp 98.2 F (36.8 C) (Oral)   Resp 16   SpO2 100%   Physical Exam  Constitutional:  She is oriented to person, place, and time. She appears well-developed and well-nourished. No distress.  HENT:  Head: Normocephalic and atraumatic.  Mouth/Throat: Oropharynx is clear and moist. No oropharyngeal exudate.  Eyes: Conjunctivae and EOM are normal. Pupils are equal, round, and reactive to light.  Neck: Normal range of motion. Neck supple.  No meningismus.  Cardiovascular: Normal rate, regular rhythm, normal heart sounds and intact distal pulses.  No murmur heard. Pulmonary/Chest: Effort normal and breath sounds normal. No respiratory distress. She exhibits no tenderness.  Abdominal: Soft. There is tenderness. There is no rebound and no guarding.  Epigastric and RUQ tenderness. Voluntary guarding  Musculoskeletal: Normal range of motion. She exhibits no edema or tenderness.  Neurological: She is alert and oriented to person, place, and time. No cranial nerve deficit. She exhibits normal muscle tone. Coordination normal.  No ataxia on finger to nose bilaterally. No pronator drift. 5/5 strength throughout. CN 2-12 intact.Equal grip strength. Sensation intact.   Skin: Skin is warm.  Psychiatric: She has a normal mood and affect. Her behavior is normal.  Nursing note and vitals reviewed.    ED Treatments / Results  Labs (all labs ordered are listed, but only abnormal results are displayed) Labs Reviewed  BASIC METABOLIC PANEL - Abnormal; Notable for the following components:      Result Value   Glucose, Bld 100 (*)    All other components within normal limits  CBC - Abnormal; Notable for the following components:   Hemoglobin 11.4 (*)    All other components within normal limits  HEPATIC FUNCTION PANEL - Abnormal; Notable for the following components:   Albumin 3.0 (*)    Bilirubin, Direct <0.1 (*)    All other components within normal limits  LIPASE, BLOOD  I-STAT TROPONIN, ED  I-STAT TROPONIN, ED    EKG  EKG Interpretation  Date/Time:  Friday May 30 2017  19:48:18 EDT Ventricular Rate:  61 PR Interval:  144 QRS Duration: 66 QT Interval:  384 QTC Calculation: 386 R Axis:   43 Text Interpretation:  Normal sinus rhythm Normal ECG No old tracing to compare Confirmed by [place], Inocente Salles [phone]) on 05/30/2017 7:56:26 PM       Radiology Dg Chest 2 View  Result Date: 05/30/2017 CLINICAL DATA:  Chest pain EXAM: CHEST - 2 VIEW COMPARISON:  10/18/2013 chest radiograph. FINDINGS: Stable cardiomediastinal silhouette with normal heart size. No pneumothorax. No pleural effusion. Lungs appear clear, with no acute consolidative airspace disease and no pulmonary edema. Cholecystectomy clips are seen in the right upper quadrant of the abdomen. IMPRESSION: No active cardiopulmonary disease. Electronically Signed   By: Ilona Sorrel M.D.   On: 05/30/2017 20:44   Ct Abdomen Pelvis W Contrast  Result Date: 05/31/2017 CLINICAL DATA:  Abdominal pain for several weeks. EXAM: CT  ABDOMEN AND PELVIS WITH CONTRAST TECHNIQUE: Multidetector CT imaging of the abdomen and pelvis was performed using the standard protocol following bolus administration of intravenous contrast. CONTRAST:  100 cc ISOVUE-300 IOPAMIDOL (ISOVUE-300) INJECTION 61% COMPARISON:  11/05/2005 CT abdomen/pelvis. FINDINGS: Motion degraded scan, limiting assessment. Lower chest: No significant pulmonary nodules or acute consolidative airspace disease. Hepatobiliary: Normal liver size. No liver mass. Cholecystectomy. No biliary ductal dilatation. Pancreas: Normal, with no mass or duct dilation. Spleen: Normal size. No mass. Adrenals/Urinary Tract: Normal adrenals. Normal kidneys with no hydronephrosis and no renal mass. Normal bladder. Stomach/Bowel: Small hiatal hernia. Otherwise normal nondistended stomach. Normal caliber small bowel with no small bowel wall thickening. Normal appendix. Moderate sigmoid diverticulosis. No large bowel wall thickening or pericolonic fat stranding. Vascular/Lymphatic: Atherosclerotic  nonaneurysmal abdominal aorta. Patent portal, splenic, hepatic and renal veins. No pathologically enlarged lymph nodes in the abdomen or pelvis. Reproductive: Grossly normal uterus.  No adnexal mass. Other: No pneumoperitoneum, ascites or focal fluid collection. Musculoskeletal: No aggressive appearing focal osseous lesions. Healed deformity in right L3 transverse process is stable since 2007. Mild-to-moderate thoracolumbar spondylosis. IMPRESSION: 1. Limited motion degraded scan. 2. No acute abnormality. No evidence of bowel obstruction or acute bowel inflammation. Moderate sigmoid diverticulosis, with no evidence of acute diverticulitis. 3. Small hiatal hernia. 4.  Aortic Atherosclerosis (ICD10-I70.0). Electronically Signed   By: Ilona Sorrel M.D.   On: 05/31/2017 01:47    Procedures Procedures (including critical care time)  Medications Ordered in ED Medications  gi cocktail (Maalox,Lidocaine,Donnatal) (not administered)  ondansetron (ZOFRAN) injection 4 mg (not administered)     Initial Impression / Assessment and Plan / ED Course  I have reviewed the triage vital signs and the nursing notes.  Pertinent labs & imaging results that were available during my care of the patient were reviewed by me and considered in my medical decision making (see chart for details).    Patient with episode of central chest pain since 7 AM.  This resolved after one episode of vomiting.  She feels better at this time.  Her EKG is normal sinus rhythm.  First troponin is negative.  We will add LFTs and lipase.  Low suspicion for ACS given ongoing pain all day and negative troponins.  LFTs and lipase are normal.  Troponin negative x2.  Low suspicion for ACS given pain ongoing all day yesterday and today.  CT abdomen is reassuring.  Previous cholecystectomy.  Suspect pain is likely GI in origin.  Will start PPI.  Follow-up with PCP for outpatient stress test.  Instructed to return to the ED if pain becomes  exertional, associated with shortness of breath, diaphoresis or any other concerns.  Final Clinical Impressions(s) / ED Diagnoses   Final diagnoses:  Atypical chest pain  Epigastric pain    ED Discharge Orders    None       Bakari Nikolai, Annie Main, MD 05/31/17 [phone]

## 2017-06-02 DIAGNOSIS — R079 Chest pain, unspecified: Secondary | ICD-10-CM | POA: Diagnosis not present

## 2017-06-02 DIAGNOSIS — I10 Essential (primary) hypertension: Secondary | ICD-10-CM | POA: Diagnosis not present

## 2017-06-02 DIAGNOSIS — Z299 Encounter for prophylactic measures, unspecified: Secondary | ICD-10-CM | POA: Diagnosis not present

## 2017-06-02 DIAGNOSIS — Z6841 Body Mass Index (BMI) 40.0 and over, adult: Secondary | ICD-10-CM | POA: Diagnosis not present

## 2017-06-02 DIAGNOSIS — K219 Gastro-esophageal reflux disease without esophagitis: Secondary | ICD-10-CM | POA: Diagnosis not present

## 2017-06-09 DIAGNOSIS — R079 Chest pain, unspecified: Secondary | ICD-10-CM | POA: Diagnosis not present

## 2017-06-13 DIAGNOSIS — E7800 Pure hypercholesterolemia, unspecified: Secondary | ICD-10-CM | POA: Diagnosis not present

## 2017-06-13 DIAGNOSIS — I10 Essential (primary) hypertension: Secondary | ICD-10-CM | POA: Diagnosis not present

## 2017-06-27 ENCOUNTER — Encounter: Payer: Self-pay | Admitting: Internal Medicine

## 2017-06-27 ENCOUNTER — Ambulatory Visit (INDEPENDENT_AMBULATORY_CARE_PROVIDER_SITE_OTHER): Payer: Medicare Other | Admitting: Internal Medicine

## 2017-06-27 VITALS — BP 116/78 | HR 70 | Ht 62.0 in | Wt 229.0 lb

## 2017-06-27 DIAGNOSIS — R079 Chest pain, unspecified: Secondary | ICD-10-CM | POA: Diagnosis not present

## 2017-06-27 DIAGNOSIS — E782 Mixed hyperlipidemia: Secondary | ICD-10-CM

## 2017-06-27 DIAGNOSIS — R0602 Shortness of breath: Secondary | ICD-10-CM

## 2017-06-27 NOTE — Patient Instructions (Signed)
Your physician recommends that you schedule a follow-up appointment in: to be determined after tests.    Your physician has requested that you have an echocardiogram. Echocardiography is a painless test that uses sound waves to create images of your heart. It provides your doctor with information about the size and shape of your heart and how well your heart's chambers and valves are working. This procedure takes approximately one hour. There are no restrictions for this procedure.   Your physician has requested that you have a lexiscan myoview. For further information please visit [email or web address]. Please follow instruction sheet, as given.     Your physician recommends that you continue on your current medications as directed. Please refer to the Current Medication list given to you today.    If you need a refill on your cardiac medications before your next appointment, please call your pharmacy.     No lab work ordered       Thank you for choosing [facility] !

## 2017-06-27 NOTE — Progress Notes (Signed)
Cardiology Office Note   Date:  06/27/2017   ID:  Kristin Barnes, DOB [birth year], MRN [id number]  PCP:  Glenda Chroman, MD  Cardiologist:   Dorris Carnes, MD    Pt referred by Dr Lou Miner for evaluation of CP from Dr Woody Seller     History of Present Illness: Kristin Barnes is a 77 y.o. female with a history of CP  She has no known hixtory of DM She has a histroy of HTN, HL, DM, reflux  She went to ED on 3/15 with chest pain.  Started at 7 am  Radiated to back   Associated with SOB and N/  COnstant   Lasted all day   Vomited x 1 at 8 PM  Pain gone   Still with epigastric discomfort and RUQ pain on arrival to ED   Had chest pain day prior that lasted hours.   Labs unremarkable   PT sent home   Since d/c she has had one episode of CP that lasted 1 second.     Does say she gets SOB with activity (walking)  THis is getting worse  Does not walk regularly    Pt had echo done per Dr Woody Seller.  LVEF was 65 tyo 70%  There was evid of diastolic dysfunction   RVEF was normal  Esptimated RVP was  30 to 40    Current Meds  Medication Sig  . atorvastatin (LIPITOR) 10 MG tablet Take 10 mg by mouth daily.  Marland Kitchen losartan (COZAAR) 25 MG tablet Take 25 mg by mouth daily.     Allergies:   Patient has no known allergies.   Past Medical History:  Diagnosis Date  . Arthritis    hands and finger  . GERD (gastroesophageal reflux disease)    uses club soda  . Hyperlipidemia   . Hypertension   . Sleep apnea    states she had a CPAP machine at one time but was picked up...over 81yrs ago    Past Surgical History:  Procedure Laterality Date  . CHOLECYSTECTOMY    . THYROIDECTOMY Right 08/19/2016   Procedure: RIGHT HEMI-THYROIDECTOMY;  Surgeon: Leta Baptist, MD;  Location: [place];  Service: ENT;  Laterality: Right;  . TUBAL LIGATION       Social History:  The patient  reports that she has never smoked. She has never used smokeless tobacco. She reports that she does not drink alcohol or use drugs.    Family History:  The patient's family history includes Breast cancer in her sister; CAD in her father; CVA in her mother; Diabetes in her mother.    ROS:  Please see the history of present illness. All other systems are reviewed and  Negative to the above problem except as noted.    PHYSICAL EXAM: VS:  BP 116/78 (BP Location: Right Arm)   Pulse 70   Ht 5\' 2"  (1.575 m)   Wt 229 lb (103.9 kg)   SpO2 99%   BMI 41.88 kg/m   GEN: Morbidly obese 77 yo  in no acute distress  HEENT: normal  Neck: no JVD, carotid bruits, or masses Cardiac: RRR; no murmurs, rubs, or gallops,no edema  Respiratory:  clear to auscultation bilaterally, normal work of breathing GI: Obese   Mild diffuse tenderness  No regound   MS: no deformity Moving all extremities   Skin: warm and dry, no rash Neuro:  Strength and sensation are intact Psych: euthymic mood, full affect   EKG:  EKG  is not ordered today.  On 3/15 SR    Lipid Panel No results found for: CHOL, TRIG, HDL, CHOLHDL, VLDL, LDLCALC, LDLDIRECT    Wt Readings from Last 3 Encounters:  06/27/17 229 lb (103.9 kg)  08/19/16 228 lb (103.4 kg)      ASSESSMENT AND PLAN:  1  Chest pain  Very atypical  I do not think cardiac in origin  I would follow for now  [facility] GI   Trial of Omeprazole  2  Dypsnea  Volume is not bad  Echo on outside did comment on diastolic dysfunction   I would recomm a lexiscan myovue to r/o ischemia as cause for SOB    3 HL   Keep on statin  Current medicines are reviewed at length with the patient today.  The patient does not have concerns regarding medicines.  Signed, Dorris Carnes, MD  06/27/2017 2:28 PM    [facility] [address], [place], [place]  [address] Phone: [phone]; Fax: [phone]

## 2017-07-01 ENCOUNTER — Encounter (HOSPITAL_BASED_OUTPATIENT_CLINIC_OR_DEPARTMENT_OTHER)
Admission: RE | Admit: 2017-07-01 | Discharge: 2017-07-01 | Disposition: A | Discharge status: Home / Self Care | Payer: Medicare Other | Source: Ambulatory Visit | Attending: Internal Medicine | Admitting: Internal Medicine

## 2017-07-01 ENCOUNTER — Encounter (HOSPITAL_COMMUNITY): Payer: Self-pay

## 2017-07-01 ENCOUNTER — Encounter (HOSPITAL_COMMUNITY)
Admission: RE | Admit: 2017-07-01 | Discharge: 2017-07-01 | Disposition: A | Discharge status: Home / Self Care | Payer: Medicare Other | Source: Ambulatory Visit | Attending: Internal Medicine | Admitting: Internal Medicine

## 2017-07-01 ENCOUNTER — Other Ambulatory Visit (HOSPITAL_COMMUNITY): Payer: Medicare Other

## 2017-07-01 DIAGNOSIS — R079 Chest pain, unspecified: Secondary | ICD-10-CM | POA: Diagnosis not present

## 2017-07-01 HISTORY — DX: Type 2 diabetes mellitus without complications: E11.9

## 2017-07-01 LAB — NM MYOCAR MULTI W/SPECT W/WALL MOTION / EF
CHL CUP NUCLEAR SDS: 2
CHL CUP RESTING HR STRESS: 58 {beats}/min
CSEPPHR: 97 {beats}/min
LHR: 0.3
LVDIAVOL: 56 mL (ref 46–106)
LVSYSVOL: 12 mL
SRS: 1
SSS: 3
TID: 1.12

## 2017-07-01 MED ORDER — TECHNETIUM TC 99M TETROFOSMIN IV KIT
10.0000 | PACK | Freq: Once | INTRAVENOUS | Status: AC | PRN
  Administered 2017-07-01: 9.8 via INTRAVENOUS

## 2017-07-01 MED ORDER — SODIUM CHLORIDE 0.9% FLUSH
INTRAVENOUS | Status: AC
  Administered 2017-07-01: 10 mL via INTRAVENOUS
  Filled 2017-07-01: qty 10

## 2017-07-01 MED ORDER — REGADENOSON 0.4 MG/5ML IV SOLN
INTRAVENOUS | Status: AC
  Administered 2017-07-01: 0.4 mg via INTRAVENOUS
  Filled 2017-07-01: qty 5

## 2017-07-01 MED ORDER — TECHNETIUM TC 99M TETROFOSMIN IV KIT
30.0000 | PACK | Freq: Once | INTRAVENOUS | Status: AC | PRN
  Administered 2017-07-01: 31.1 via INTRAVENOUS

## 2017-07-22 DIAGNOSIS — I10 Essential (primary) hypertension: Secondary | ICD-10-CM | POA: Diagnosis not present

## 2017-07-22 DIAGNOSIS — E7800 Pure hypercholesterolemia, unspecified: Secondary | ICD-10-CM | POA: Diagnosis not present

## 2017-07-28 DIAGNOSIS — Z713 Dietary counseling and surveillance: Secondary | ICD-10-CM | POA: Diagnosis not present

## 2017-07-28 DIAGNOSIS — I10 Essential (primary) hypertension: Secondary | ICD-10-CM | POA: Diagnosis not present

## 2017-07-28 DIAGNOSIS — Z299 Encounter for prophylactic measures, unspecified: Secondary | ICD-10-CM | POA: Diagnosis not present

## 2017-07-28 DIAGNOSIS — Z6841 Body Mass Index (BMI) 40.0 and over, adult: Secondary | ICD-10-CM | POA: Diagnosis not present

## 2017-07-28 DIAGNOSIS — Z789 Other specified health status: Secondary | ICD-10-CM | POA: Diagnosis not present

## 2017-08-25 ENCOUNTER — Ambulatory Visit (INDEPENDENT_AMBULATORY_CARE_PROVIDER_SITE_OTHER): Payer: Medicare Other | Admitting: Otolaryngology

## 2017-08-25 DIAGNOSIS — R490 Dysphonia: Secondary | ICD-10-CM

## 2017-08-25 DIAGNOSIS — K219 Gastro-esophageal reflux disease without esophagitis: Secondary | ICD-10-CM

## 2017-09-04 DIAGNOSIS — I10 Essential (primary) hypertension: Secondary | ICD-10-CM | POA: Diagnosis not present

## 2017-09-04 DIAGNOSIS — E7800 Pure hypercholesterolemia, unspecified: Secondary | ICD-10-CM | POA: Diagnosis not present

## 2017-10-09 ENCOUNTER — Ambulatory Visit (INDEPENDENT_AMBULATORY_CARE_PROVIDER_SITE_OTHER): Payer: Medicare Other | Admitting: Otolaryngology

## 2017-10-13 ENCOUNTER — Ambulatory Visit (INDEPENDENT_AMBULATORY_CARE_PROVIDER_SITE_OTHER): Payer: Medicare Other | Admitting: Otolaryngology

## 2017-10-13 DIAGNOSIS — R490 Dysphonia: Secondary | ICD-10-CM | POA: Diagnosis not present

## 2017-10-13 DIAGNOSIS — K219 Gastro-esophageal reflux disease without esophagitis: Secondary | ICD-10-CM | POA: Diagnosis not present

## 2017-11-19 DIAGNOSIS — I10 Essential (primary) hypertension: Secondary | ICD-10-CM | POA: Diagnosis not present

## 2017-11-19 DIAGNOSIS — E7800 Pure hypercholesterolemia, unspecified: Secondary | ICD-10-CM | POA: Diagnosis not present

## 2017-12-01 DIAGNOSIS — I8390 Asymptomatic varicose veins of unspecified lower extremity: Secondary | ICD-10-CM | POA: Diagnosis not present

## 2017-12-01 DIAGNOSIS — Z299 Encounter for prophylactic measures, unspecified: Secondary | ICD-10-CM | POA: Diagnosis not present

## 2017-12-01 DIAGNOSIS — Z6841 Body Mass Index (BMI) 40.0 and over, adult: Secondary | ICD-10-CM | POA: Diagnosis not present

## 2017-12-01 DIAGNOSIS — E7800 Pure hypercholesterolemia, unspecified: Secondary | ICD-10-CM | POA: Diagnosis not present

## 2017-12-01 DIAGNOSIS — I10 Essential (primary) hypertension: Secondary | ICD-10-CM | POA: Diagnosis not present

## 2018-01-12 DIAGNOSIS — I10 Essential (primary) hypertension: Secondary | ICD-10-CM | POA: Diagnosis not present

## 2018-01-12 DIAGNOSIS — E7800 Pure hypercholesterolemia, unspecified: Secondary | ICD-10-CM | POA: Diagnosis not present

## 2018-01-15 ENCOUNTER — Ambulatory Visit (INDEPENDENT_AMBULATORY_CARE_PROVIDER_SITE_OTHER): Payer: Medicare Other | Admitting: Otolaryngology

## 2018-01-15 DIAGNOSIS — H6121 Impacted cerumen, right ear: Secondary | ICD-10-CM

## 2018-01-15 DIAGNOSIS — K219 Gastro-esophageal reflux disease without esophagitis: Secondary | ICD-10-CM

## 2018-01-15 DIAGNOSIS — R490 Dysphonia: Secondary | ICD-10-CM | POA: Diagnosis not present

## 2018-01-15 DIAGNOSIS — Z23 Encounter for immunization: Secondary | ICD-10-CM | POA: Diagnosis not present

## 2018-01-23 DIAGNOSIS — Z299 Encounter for prophylactic measures, unspecified: Secondary | ICD-10-CM | POA: Diagnosis not present

## 2018-01-23 DIAGNOSIS — I10 Essential (primary) hypertension: Secondary | ICD-10-CM | POA: Diagnosis not present

## 2018-01-23 DIAGNOSIS — J439 Emphysema, unspecified: Secondary | ICD-10-CM | POA: Diagnosis not present

## 2018-01-23 DIAGNOSIS — R0602 Shortness of breath: Secondary | ICD-10-CM | POA: Diagnosis not present

## 2018-01-23 DIAGNOSIS — Z6839 Body mass index (BMI) 39.0-39.9, adult: Secondary | ICD-10-CM | POA: Diagnosis not present

## 2018-01-23 DIAGNOSIS — R091 Pleurisy: Secondary | ICD-10-CM | POA: Diagnosis not present

## 2018-01-26 DIAGNOSIS — R079 Chest pain, unspecified: Secondary | ICD-10-CM | POA: Diagnosis not present

## 2018-01-26 DIAGNOSIS — M25512 Pain in left shoulder: Secondary | ICD-10-CM | POA: Diagnosis not present

## 2018-01-26 DIAGNOSIS — R0602 Shortness of breath: Secondary | ICD-10-CM | POA: Diagnosis not present

## 2018-01-26 DIAGNOSIS — M19012 Primary osteoarthritis, left shoulder: Secondary | ICD-10-CM | POA: Diagnosis not present

## 2018-01-26 DIAGNOSIS — I700 Atherosclerosis of aorta: Secondary | ICD-10-CM | POA: Diagnosis not present

## 2018-01-26 DIAGNOSIS — Z79899 Other long term (current) drug therapy: Secondary | ICD-10-CM | POA: Diagnosis not present

## 2018-01-26 DIAGNOSIS — E119 Type 2 diabetes mellitus without complications: Secondary | ICD-10-CM | POA: Diagnosis not present

## 2018-01-26 DIAGNOSIS — R0789 Other chest pain: Secondary | ICD-10-CM | POA: Diagnosis not present

## 2018-02-02 DIAGNOSIS — M25512 Pain in left shoulder: Secondary | ICD-10-CM | POA: Diagnosis not present

## 2018-02-02 DIAGNOSIS — Z299 Encounter for prophylactic measures, unspecified: Secondary | ICD-10-CM | POA: Diagnosis not present

## 2018-02-02 DIAGNOSIS — I10 Essential (primary) hypertension: Secondary | ICD-10-CM | POA: Diagnosis not present

## 2018-02-02 DIAGNOSIS — Z6841 Body Mass Index (BMI) 40.0 and over, adult: Secondary | ICD-10-CM | POA: Diagnosis not present

## 2018-02-09 DIAGNOSIS — E7800 Pure hypercholesterolemia, unspecified: Secondary | ICD-10-CM | POA: Diagnosis not present

## 2018-02-09 DIAGNOSIS — I10 Essential (primary) hypertension: Secondary | ICD-10-CM | POA: Diagnosis not present

## 2018-02-24 DIAGNOSIS — M25512 Pain in left shoulder: Secondary | ICD-10-CM | POA: Diagnosis not present

## 2018-02-24 DIAGNOSIS — I10 Essential (primary) hypertension: Secondary | ICD-10-CM | POA: Diagnosis not present

## 2018-02-24 DIAGNOSIS — Z299 Encounter for prophylactic measures, unspecified: Secondary | ICD-10-CM | POA: Diagnosis not present

## 2018-02-24 DIAGNOSIS — Z6841 Body Mass Index (BMI) 40.0 and over, adult: Secondary | ICD-10-CM | POA: Diagnosis not present

## 2018-02-24 DIAGNOSIS — I8390 Asymptomatic varicose veins of unspecified lower extremity: Secondary | ICD-10-CM | POA: Diagnosis not present

## 2018-03-26 DIAGNOSIS — E11319 Type 2 diabetes mellitus with unspecified diabetic retinopathy without macular edema: Secondary | ICD-10-CM | POA: Diagnosis not present

## 2018-03-27 DIAGNOSIS — Z6838 Body mass index (BMI) 38.0-38.9, adult: Secondary | ICD-10-CM | POA: Diagnosis not present

## 2018-03-27 DIAGNOSIS — R252 Cramp and spasm: Secondary | ICD-10-CM | POA: Diagnosis not present

## 2018-03-27 DIAGNOSIS — Z299 Encounter for prophylactic measures, unspecified: Secondary | ICD-10-CM | POA: Diagnosis not present

## 2018-03-27 DIAGNOSIS — I10 Essential (primary) hypertension: Secondary | ICD-10-CM | POA: Diagnosis not present

## 2018-03-27 DIAGNOSIS — E7800 Pure hypercholesterolemia, unspecified: Secondary | ICD-10-CM | POA: Diagnosis not present

## 2018-03-30 DIAGNOSIS — E7800 Pure hypercholesterolemia, unspecified: Secondary | ICD-10-CM | POA: Diagnosis not present

## 2018-03-30 DIAGNOSIS — I10 Essential (primary) hypertension: Secondary | ICD-10-CM | POA: Diagnosis not present

## 2018-04-16 ENCOUNTER — Ambulatory Visit (INDEPENDENT_AMBULATORY_CARE_PROVIDER_SITE_OTHER): Payer: Medicare Other | Admitting: Otolaryngology

## 2018-04-16 DIAGNOSIS — R490 Dysphonia: Secondary | ICD-10-CM

## 2018-04-16 DIAGNOSIS — K219 Gastro-esophageal reflux disease without esophagitis: Secondary | ICD-10-CM

## 2018-04-27 DIAGNOSIS — E7800 Pure hypercholesterolemia, unspecified: Secondary | ICD-10-CM | POA: Diagnosis not present

## 2018-04-27 DIAGNOSIS — I10 Essential (primary) hypertension: Secondary | ICD-10-CM | POA: Diagnosis not present

## 2018-05-05 DIAGNOSIS — Z1331 Encounter for screening for depression: Secondary | ICD-10-CM | POA: Diagnosis not present

## 2018-05-05 DIAGNOSIS — Z299 Encounter for prophylactic measures, unspecified: Secondary | ICD-10-CM | POA: Diagnosis not present

## 2018-05-05 DIAGNOSIS — E7800 Pure hypercholesterolemia, unspecified: Secondary | ICD-10-CM | POA: Diagnosis not present

## 2018-05-05 DIAGNOSIS — Z1339 Encounter for screening examination for other mental health and behavioral disorders: Secondary | ICD-10-CM | POA: Diagnosis not present

## 2018-05-05 DIAGNOSIS — Z79899 Other long term (current) drug therapy: Secondary | ICD-10-CM | POA: Diagnosis not present

## 2018-05-05 DIAGNOSIS — Z7189 Other specified counseling: Secondary | ICD-10-CM | POA: Diagnosis not present

## 2018-05-05 DIAGNOSIS — Z6839 Body mass index (BMI) 39.0-39.9, adult: Secondary | ICD-10-CM | POA: Diagnosis not present

## 2018-05-05 DIAGNOSIS — R5383 Other fatigue: Secondary | ICD-10-CM | POA: Diagnosis not present

## 2018-05-05 DIAGNOSIS — Z1211 Encounter for screening for malignant neoplasm of colon: Secondary | ICD-10-CM | POA: Diagnosis not present

## 2018-05-05 DIAGNOSIS — Z0000 Encounter for general adult medical examination without abnormal findings: Secondary | ICD-10-CM | POA: Diagnosis not present

## 2018-05-05 DIAGNOSIS — I10 Essential (primary) hypertension: Secondary | ICD-10-CM | POA: Diagnosis not present

## 2018-05-14 DIAGNOSIS — E2839 Other primary ovarian failure: Secondary | ICD-10-CM | POA: Diagnosis not present

## 2018-05-15 DIAGNOSIS — Z299 Encounter for prophylactic measures, unspecified: Secondary | ICD-10-CM | POA: Diagnosis not present

## 2018-05-15 DIAGNOSIS — M7552 Bursitis of left shoulder: Secondary | ICD-10-CM | POA: Diagnosis not present

## 2018-05-15 DIAGNOSIS — Z6839 Body mass index (BMI) 39.0-39.9, adult: Secondary | ICD-10-CM | POA: Diagnosis not present

## 2018-05-15 DIAGNOSIS — I10 Essential (primary) hypertension: Secondary | ICD-10-CM | POA: Diagnosis not present

## 2018-05-22 DIAGNOSIS — M7552 Bursitis of left shoulder: Secondary | ICD-10-CM | POA: Diagnosis not present

## 2018-05-29 DIAGNOSIS — M7552 Bursitis of left shoulder: Secondary | ICD-10-CM | POA: Diagnosis not present

## 2018-06-01 DIAGNOSIS — M7552 Bursitis of left shoulder: Secondary | ICD-10-CM | POA: Diagnosis not present

## 2018-06-08 DIAGNOSIS — M7552 Bursitis of left shoulder: Secondary | ICD-10-CM | POA: Diagnosis not present

## 2018-06-12 DIAGNOSIS — M7552 Bursitis of left shoulder: Secondary | ICD-10-CM | POA: Diagnosis not present

## 2018-06-15 DIAGNOSIS — M7552 Bursitis of left shoulder: Secondary | ICD-10-CM | POA: Diagnosis not present

## 2018-06-26 DIAGNOSIS — I10 Essential (primary) hypertension: Secondary | ICD-10-CM | POA: Diagnosis not present

## 2018-06-26 DIAGNOSIS — E7800 Pure hypercholesterolemia, unspecified: Secondary | ICD-10-CM | POA: Diagnosis not present

## 2018-07-15 DIAGNOSIS — R195 Other fecal abnormalities: Secondary | ICD-10-CM | POA: Diagnosis not present

## 2018-07-24 DIAGNOSIS — I10 Essential (primary) hypertension: Secondary | ICD-10-CM | POA: Diagnosis not present

## 2018-07-24 DIAGNOSIS — Z299 Encounter for prophylactic measures, unspecified: Secondary | ICD-10-CM | POA: Diagnosis not present

## 2018-07-24 DIAGNOSIS — Z713 Dietary counseling and surveillance: Secondary | ICD-10-CM | POA: Diagnosis not present

## 2018-07-24 DIAGNOSIS — M545 Low back pain: Secondary | ICD-10-CM | POA: Diagnosis not present

## 2018-07-24 DIAGNOSIS — Z6838 Body mass index (BMI) 38.0-38.9, adult: Secondary | ICD-10-CM | POA: Diagnosis not present

## 2018-08-03 DIAGNOSIS — I10 Essential (primary) hypertension: Secondary | ICD-10-CM | POA: Diagnosis not present

## 2018-08-03 DIAGNOSIS — Z713 Dietary counseling and surveillance: Secondary | ICD-10-CM | POA: Diagnosis not present

## 2018-08-03 DIAGNOSIS — Z6839 Body mass index (BMI) 39.0-39.9, adult: Secondary | ICD-10-CM | POA: Diagnosis not present

## 2018-08-03 DIAGNOSIS — Z299 Encounter for prophylactic measures, unspecified: Secondary | ICD-10-CM | POA: Diagnosis not present

## 2018-08-03 DIAGNOSIS — E7800 Pure hypercholesterolemia, unspecified: Secondary | ICD-10-CM | POA: Diagnosis not present

## 2018-08-04 DIAGNOSIS — Z1159 Encounter for screening for other viral diseases: Secondary | ICD-10-CM | POA: Diagnosis not present

## 2018-08-06 DIAGNOSIS — Z9049 Acquired absence of other specified parts of digestive tract: Secondary | ICD-10-CM | POA: Diagnosis not present

## 2018-08-06 DIAGNOSIS — Z87442 Personal history of urinary calculi: Secondary | ICD-10-CM | POA: Diagnosis not present

## 2018-08-06 DIAGNOSIS — D125 Benign neoplasm of sigmoid colon: Secondary | ICD-10-CM | POA: Diagnosis not present

## 2018-08-06 DIAGNOSIS — K5730 Diverticulosis of large intestine without perforation or abscess without bleeding: Secondary | ICD-10-CM | POA: Diagnosis not present

## 2018-08-06 DIAGNOSIS — K921 Melena: Secondary | ICD-10-CM | POA: Diagnosis not present

## 2018-08-06 DIAGNOSIS — Z87440 Personal history of urinary (tract) infections: Secondary | ICD-10-CM | POA: Diagnosis not present

## 2018-08-06 DIAGNOSIS — K6289 Other specified diseases of anus and rectum: Secondary | ICD-10-CM | POA: Diagnosis not present

## 2018-08-06 DIAGNOSIS — D127 Benign neoplasm of rectosigmoid junction: Secondary | ICD-10-CM | POA: Diagnosis not present

## 2018-08-06 DIAGNOSIS — K644 Residual hemorrhoidal skin tags: Secondary | ICD-10-CM | POA: Diagnosis not present

## 2018-08-06 DIAGNOSIS — Z79899 Other long term (current) drug therapy: Secondary | ICD-10-CM | POA: Diagnosis not present

## 2018-08-06 DIAGNOSIS — M1990 Unspecified osteoarthritis, unspecified site: Secondary | ICD-10-CM | POA: Diagnosis not present

## 2018-08-06 DIAGNOSIS — E119 Type 2 diabetes mellitus without complications: Secondary | ICD-10-CM | POA: Diagnosis not present

## 2018-08-06 DIAGNOSIS — I10 Essential (primary) hypertension: Secondary | ICD-10-CM | POA: Diagnosis not present

## 2018-08-06 DIAGNOSIS — R195 Other fecal abnormalities: Secondary | ICD-10-CM | POA: Diagnosis not present

## 2018-08-06 DIAGNOSIS — D124 Benign neoplasm of descending colon: Secondary | ICD-10-CM | POA: Diagnosis not present

## 2018-08-14 DIAGNOSIS — E7800 Pure hypercholesterolemia, unspecified: Secondary | ICD-10-CM | POA: Diagnosis not present

## 2018-09-10 DIAGNOSIS — I10 Essential (primary) hypertension: Secondary | ICD-10-CM | POA: Diagnosis not present

## 2018-09-14 DIAGNOSIS — D126 Benign neoplasm of colon, unspecified: Secondary | ICD-10-CM | POA: Diagnosis not present

## 2018-09-14 DIAGNOSIS — E7800 Pure hypercholesterolemia, unspecified: Secondary | ICD-10-CM | POA: Diagnosis not present

## 2018-10-02 DIAGNOSIS — I10 Essential (primary) hypertension: Secondary | ICD-10-CM | POA: Diagnosis not present

## 2018-10-08 DIAGNOSIS — E7800 Pure hypercholesterolemia, unspecified: Secondary | ICD-10-CM | POA: Diagnosis not present

## 2018-11-03 DIAGNOSIS — I10 Essential (primary) hypertension: Secondary | ICD-10-CM | POA: Diagnosis not present

## 2018-11-03 DIAGNOSIS — Z299 Encounter for prophylactic measures, unspecified: Secondary | ICD-10-CM | POA: Diagnosis not present

## 2018-11-03 DIAGNOSIS — K219 Gastro-esophageal reflux disease without esophagitis: Secondary | ICD-10-CM | POA: Diagnosis not present

## 2018-11-03 DIAGNOSIS — Z6838 Body mass index (BMI) 38.0-38.9, adult: Secondary | ICD-10-CM | POA: Diagnosis not present

## 2018-11-03 DIAGNOSIS — E7800 Pure hypercholesterolemia, unspecified: Secondary | ICD-10-CM | POA: Diagnosis not present

## 2018-11-05 DIAGNOSIS — E7800 Pure hypercholesterolemia, unspecified: Secondary | ICD-10-CM | POA: Diagnosis not present

## 2018-11-09 DIAGNOSIS — I10 Essential (primary) hypertension: Secondary | ICD-10-CM | POA: Diagnosis not present

## 2018-12-08 DIAGNOSIS — I10 Essential (primary) hypertension: Secondary | ICD-10-CM | POA: Diagnosis not present

## 2018-12-16 DIAGNOSIS — I10 Essential (primary) hypertension: Secondary | ICD-10-CM | POA: Diagnosis not present

## 2019-01-04 DIAGNOSIS — Z299 Encounter for prophylactic measures, unspecified: Secondary | ICD-10-CM | POA: Diagnosis not present

## 2019-01-04 DIAGNOSIS — Z6838 Body mass index (BMI) 38.0-38.9, adult: Secondary | ICD-10-CM | POA: Diagnosis not present

## 2019-01-04 DIAGNOSIS — I10 Essential (primary) hypertension: Secondary | ICD-10-CM | POA: Diagnosis not present

## 2019-01-04 DIAGNOSIS — J439 Emphysema, unspecified: Secondary | ICD-10-CM | POA: Diagnosis not present

## 2019-01-12 DIAGNOSIS — I10 Essential (primary) hypertension: Secondary | ICD-10-CM | POA: Diagnosis not present

## 2019-01-21 DIAGNOSIS — Z87442 Personal history of urinary calculi: Secondary | ICD-10-CM | POA: Diagnosis not present

## 2019-01-21 DIAGNOSIS — Z9049 Acquired absence of other specified parts of digestive tract: Secondary | ICD-10-CM | POA: Diagnosis not present

## 2019-01-21 DIAGNOSIS — K5792 Diverticulitis of intestine, part unspecified, without perforation or abscess without bleeding: Secondary | ICD-10-CM | POA: Diagnosis not present

## 2019-01-21 DIAGNOSIS — R1030 Lower abdominal pain, unspecified: Secondary | ICD-10-CM | POA: Diagnosis not present

## 2019-01-21 DIAGNOSIS — Z9851 Tubal ligation status: Secondary | ICD-10-CM | POA: Diagnosis not present

## 2019-01-21 DIAGNOSIS — E119 Type 2 diabetes mellitus without complications: Secondary | ICD-10-CM | POA: Diagnosis not present

## 2019-01-21 DIAGNOSIS — M1990 Unspecified osteoarthritis, unspecified site: Secondary | ICD-10-CM | POA: Diagnosis not present

## 2019-01-21 DIAGNOSIS — Z79899 Other long term (current) drug therapy: Secondary | ICD-10-CM | POA: Diagnosis not present

## 2019-01-21 DIAGNOSIS — I10 Essential (primary) hypertension: Secondary | ICD-10-CM | POA: Diagnosis not present

## 2019-01-21 DIAGNOSIS — K5730 Diverticulosis of large intestine without perforation or abscess without bleeding: Secondary | ICD-10-CM | POA: Diagnosis not present

## 2019-02-15 DIAGNOSIS — R509 Fever, unspecified: Secondary | ICD-10-CM | POA: Diagnosis not present

## 2019-02-15 DIAGNOSIS — R079 Chest pain, unspecified: Secondary | ICD-10-CM | POA: Diagnosis not present

## 2019-02-15 DIAGNOSIS — R42 Dizziness and giddiness: Secondary | ICD-10-CM | POA: Diagnosis not present

## 2019-02-15 DIAGNOSIS — R110 Nausea: Secondary | ICD-10-CM | POA: Diagnosis not present

## 2019-02-24 DIAGNOSIS — E7800 Pure hypercholesterolemia, unspecified: Secondary | ICD-10-CM | POA: Diagnosis not present

## 2019-03-05 DIAGNOSIS — I10 Essential (primary) hypertension: Secondary | ICD-10-CM | POA: Diagnosis not present

## 2019-03-24 DIAGNOSIS — E7800 Pure hypercholesterolemia, unspecified: Secondary | ICD-10-CM | POA: Diagnosis not present

## 2019-04-14 DIAGNOSIS — I10 Essential (primary) hypertension: Secondary | ICD-10-CM | POA: Diagnosis not present

## 2019-05-03 DIAGNOSIS — E7800 Pure hypercholesterolemia, unspecified: Secondary | ICD-10-CM | POA: Diagnosis not present

## 2019-05-10 DIAGNOSIS — Z6837 Body mass index (BMI) 37.0-37.9, adult: Secondary | ICD-10-CM | POA: Diagnosis not present

## 2019-05-10 DIAGNOSIS — E7800 Pure hypercholesterolemia, unspecified: Secondary | ICD-10-CM | POA: Diagnosis not present

## 2019-05-10 DIAGNOSIS — Z0000 Encounter for general adult medical examination without abnormal findings: Secondary | ICD-10-CM | POA: Diagnosis not present

## 2019-05-10 DIAGNOSIS — R5383 Other fatigue: Secondary | ICD-10-CM | POA: Diagnosis not present

## 2019-05-10 DIAGNOSIS — Z299 Encounter for prophylactic measures, unspecified: Secondary | ICD-10-CM | POA: Diagnosis not present

## 2019-05-10 DIAGNOSIS — Z1339 Encounter for screening examination for other mental health and behavioral disorders: Secondary | ICD-10-CM | POA: Diagnosis not present

## 2019-05-10 DIAGNOSIS — Z1211 Encounter for screening for malignant neoplasm of colon: Secondary | ICD-10-CM | POA: Diagnosis not present

## 2019-05-10 DIAGNOSIS — Z1331 Encounter for screening for depression: Secondary | ICD-10-CM | POA: Diagnosis not present

## 2019-05-10 DIAGNOSIS — Z7189 Other specified counseling: Secondary | ICD-10-CM | POA: Diagnosis not present

## 2019-05-10 DIAGNOSIS — Z79899 Other long term (current) drug therapy: Secondary | ICD-10-CM | POA: Diagnosis not present

## 2019-05-10 DIAGNOSIS — I10 Essential (primary) hypertension: Secondary | ICD-10-CM | POA: Diagnosis not present

## 2019-05-16 DIAGNOSIS — I10 Essential (primary) hypertension: Secondary | ICD-10-CM | POA: Diagnosis not present

## 2019-05-18 DIAGNOSIS — E7800 Pure hypercholesterolemia, unspecified: Secondary | ICD-10-CM | POA: Diagnosis not present

## 2019-06-07 IMAGING — CT CT ABD-PELV W/ CM
2 of 5 series · 16 of 46 positions shown, 18 images · IV contrast (iopamidol)
Comparison: 11/05/2005 CT abdomen/pelvis.

CLINICAL DATA: Abdominal pain for several weeks.

EXAM:
CT ABDOMEN AND PELVIS WITH CONTRAST
TECHNIQUE: Multidetector CT imaging of the abdomen and pelvis was performed
using the standard protocol following bolus administration of
intravenous contrast.
CONTRAST:  100 cc P1WFID-D88 IOPAMIDOL (P1WFID-D88) INJECTION 61%

[Series 3: abd/ pelvis 5.0 i30f 2 · axial · 0.95mm/px · z∈[+774,+1174]mm · 13 of 92 slices shown, 15 images]
[im 6/92  soft-tissue]
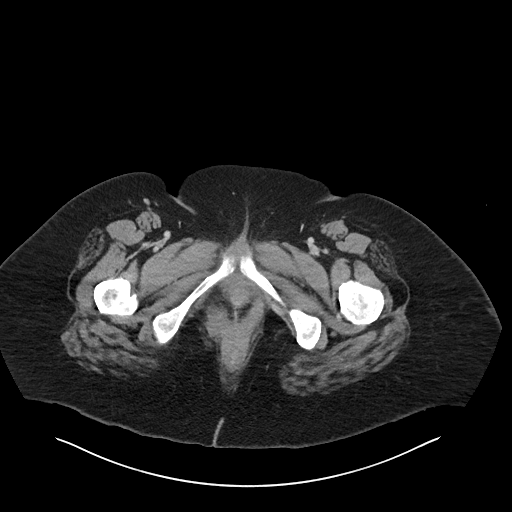
[im 6/92  bone]
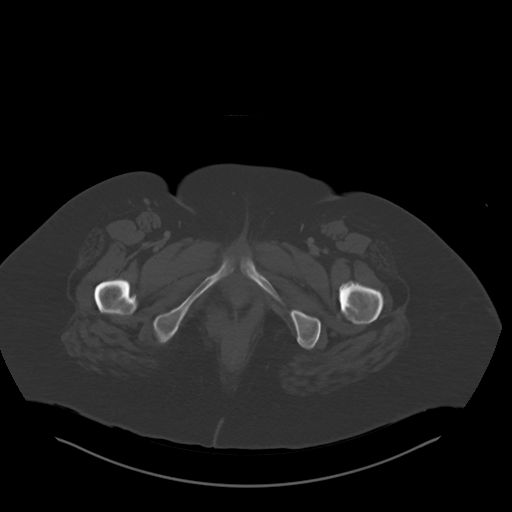
[im 11/92  soft-tissue]
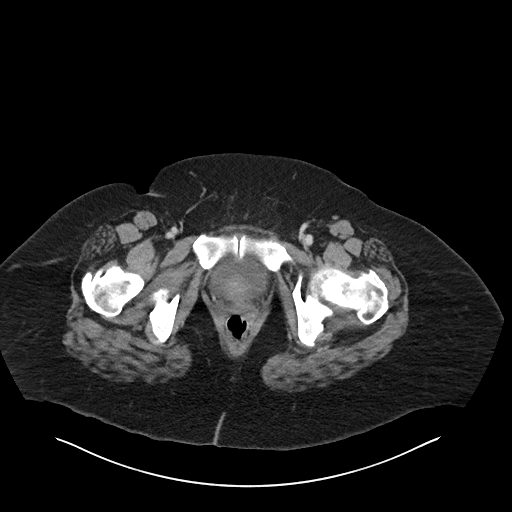
[im 21/92  soft-tissue]
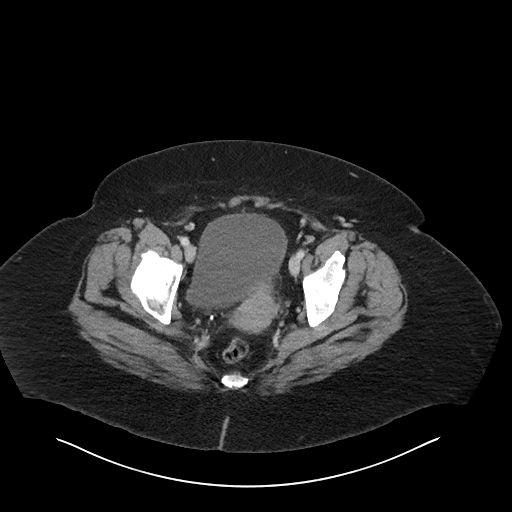
[im 26/92  soft-tissue]
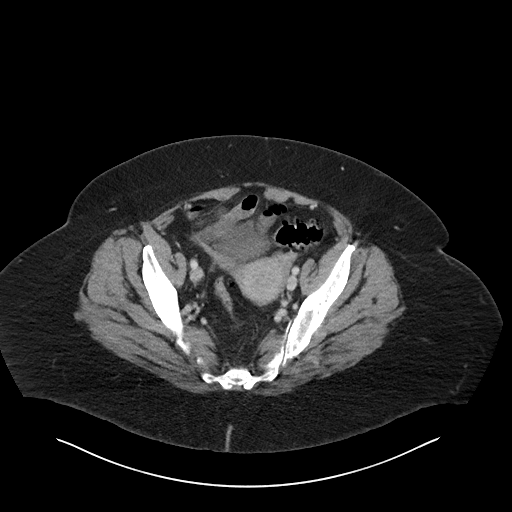
[im 31/92  soft-tissue]
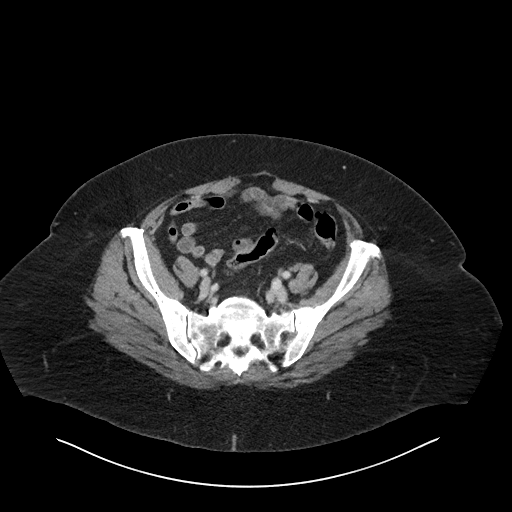
[im 41/92  soft-tissue]
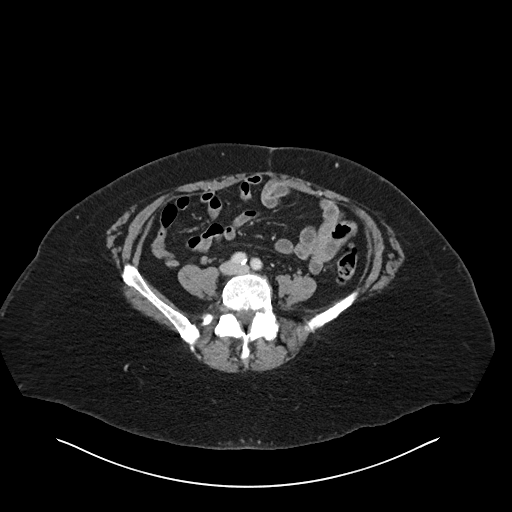
[im 46/92  soft-tissue]
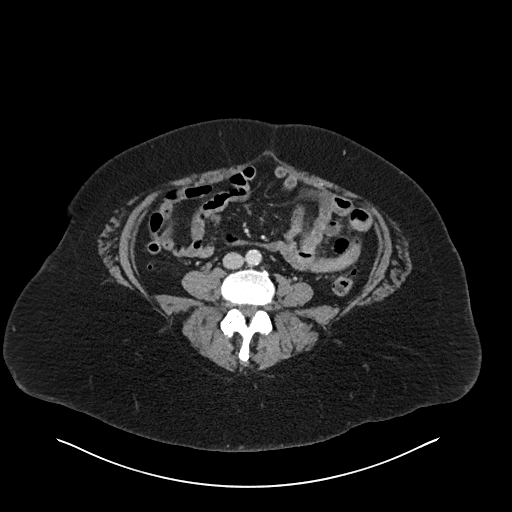
[im 51/92  soft-tissue]
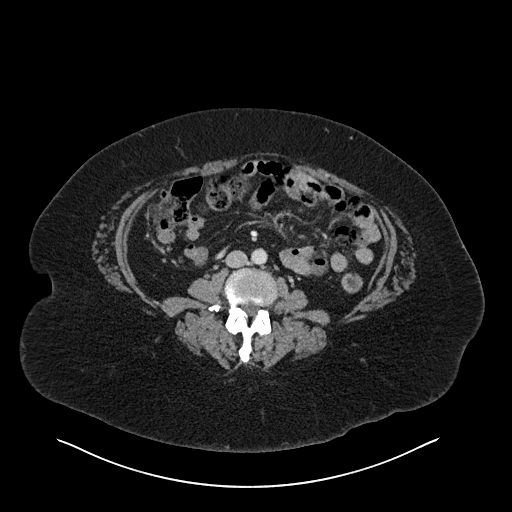
[im 61/92  soft-tissue]
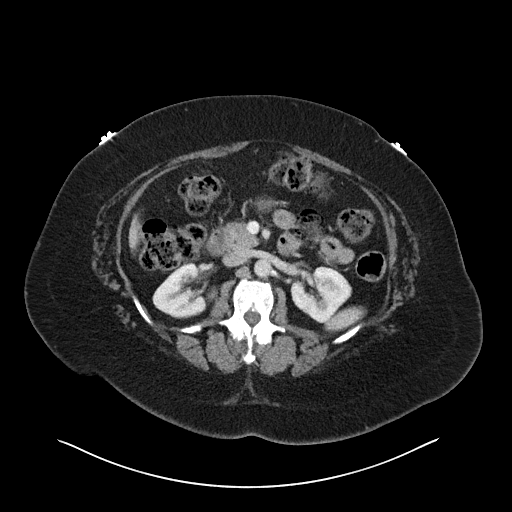
[im 61/92  bone]
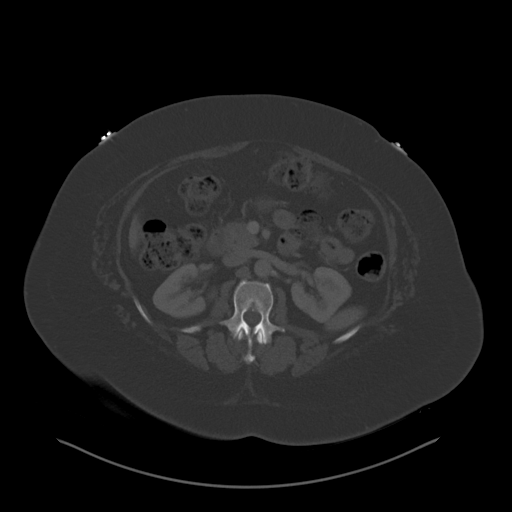
[im 66/92  soft-tissue]
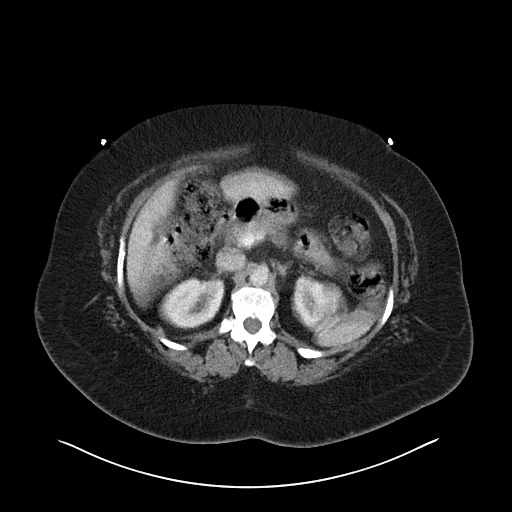
[im 71/92  soft-tissue]
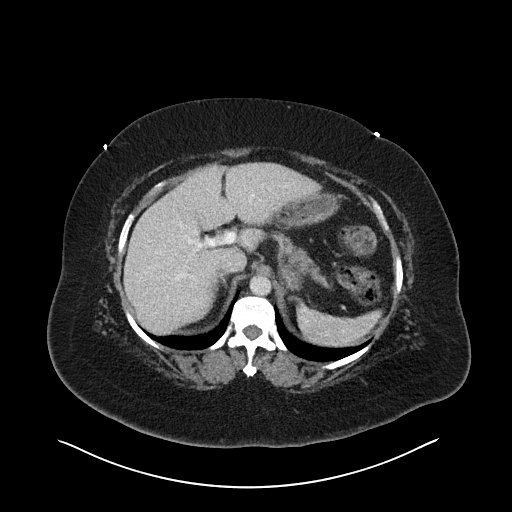
[im 81/92  soft-tissue]
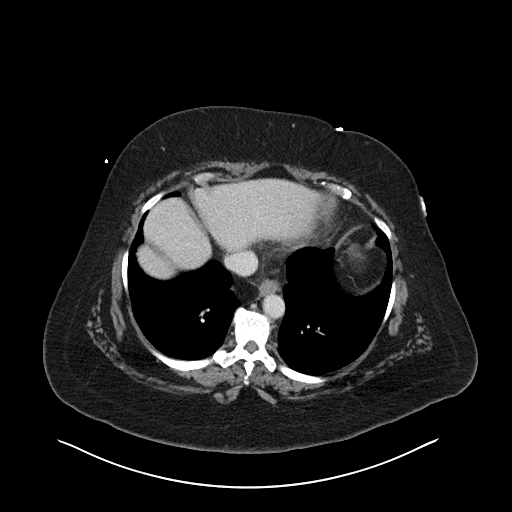
[im 86/92  soft-tissue]
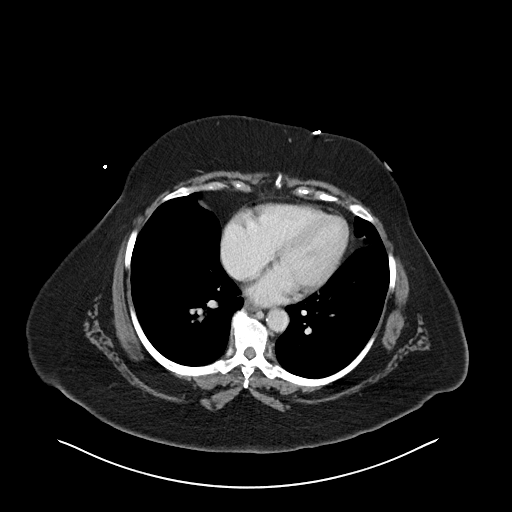

[Series 6: coronal soft tissue · coronal · 0.67mm/px · 3 of 99 slices shown]
[im 33/99  soft-tissue]
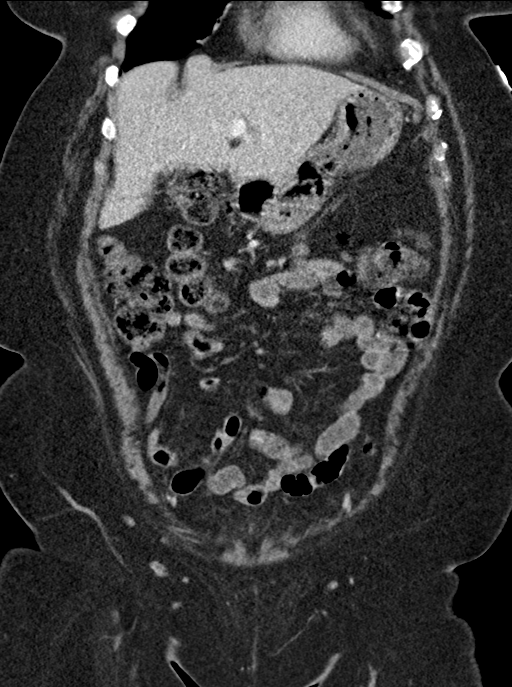
[im 44/99  soft-tissue]
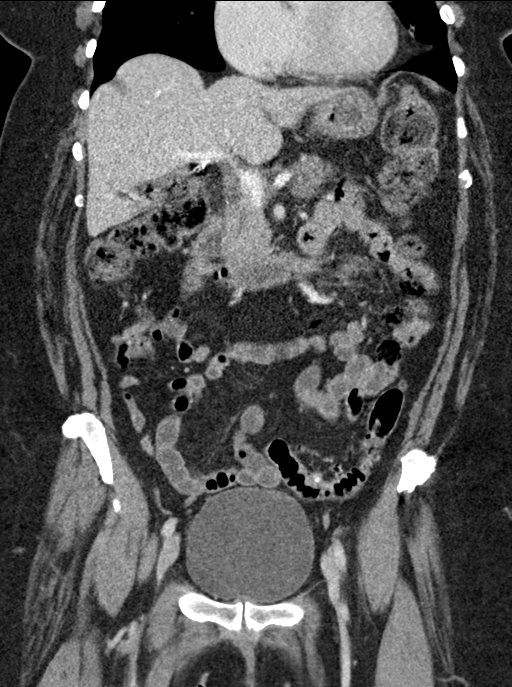
[im 55/99  soft-tissue]
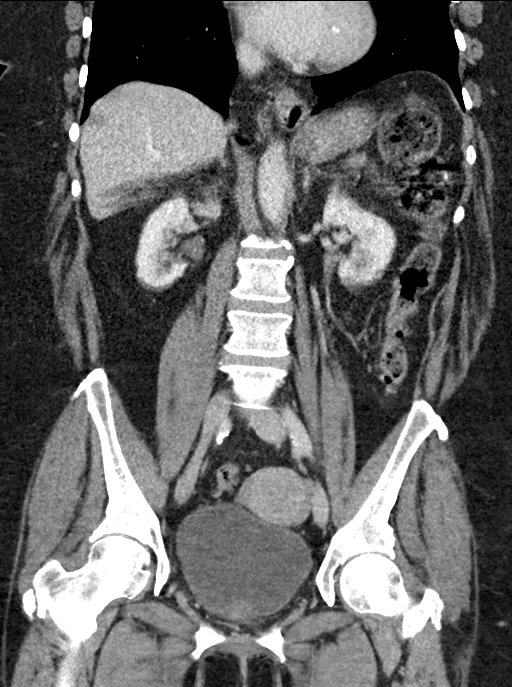

[16 of 46 positions shown; findings below may reference images not displayed]

FINDINGS: Motion degraded scan, limiting assessment.

Lower chest: No significant pulmonary nodules or acute consolidative
airspace disease.

Hepatobiliary: Normal liver size. No liver mass. Cholecystectomy. No
biliary ductal dilatation.

Pancreas: Normal, with no mass or duct dilation.

Spleen: Normal size. No mass.

Adrenals/Urinary Tract: Normal adrenals. Normal kidneys with no
hydronephrosis and no renal mass. Normal bladder.

Stomach/Bowel: Small hiatal hernia. Otherwise normal nondistended
stomach. Normal caliber small bowel with no small bowel wall
thickening. Normal appendix. Moderate sigmoid diverticulosis. No
large bowel wall thickening or pericolonic fat stranding.

Vascular/Lymphatic: Atherosclerotic nonaneurysmal abdominal aorta.
Patent portal, splenic, hepatic and renal veins. No pathologically
enlarged lymph nodes in the abdomen or pelvis.

Reproductive: Grossly normal uterus.  No adnexal mass.

Other: No pneumoperitoneum, ascites or focal fluid collection.

Musculoskeletal: No aggressive appearing focal osseous lesions.
Healed deformity in right L3 transverse process is stable since
8001. Mild-to-moderate thoracolumbar spondylosis.
IMPRESSION: 1. Limited motion degraded scan.
2. No acute abnormality. No evidence of bowel obstruction or acute
bowel inflammation. Moderate sigmoid diverticulosis, with no
evidence of acute diverticulitis.
3. Small hiatal hernia.
4.  Aortic Atherosclerosis (PKW81-IM4.4).

## 2019-06-15 DIAGNOSIS — I10 Essential (primary) hypertension: Secondary | ICD-10-CM | POA: Diagnosis not present

## 2019-06-17 DIAGNOSIS — E7800 Pure hypercholesterolemia, unspecified: Secondary | ICD-10-CM | POA: Diagnosis not present

## 2019-06-23 DIAGNOSIS — J439 Emphysema, unspecified: Secondary | ICD-10-CM | POA: Diagnosis not present

## 2019-06-23 DIAGNOSIS — Z299 Encounter for prophylactic measures, unspecified: Secondary | ICD-10-CM | POA: Diagnosis not present

## 2019-06-23 DIAGNOSIS — J309 Allergic rhinitis, unspecified: Secondary | ICD-10-CM | POA: Diagnosis not present

## 2019-06-23 DIAGNOSIS — R251 Tremor, unspecified: Secondary | ICD-10-CM | POA: Diagnosis not present

## 2019-06-23 DIAGNOSIS — I10 Essential (primary) hypertension: Secondary | ICD-10-CM | POA: Diagnosis not present

## 2019-06-23 DIAGNOSIS — B029 Zoster without complications: Secondary | ICD-10-CM | POA: Diagnosis not present

## 2019-07-16 DIAGNOSIS — I10 Essential (primary) hypertension: Secondary | ICD-10-CM | POA: Diagnosis not present

## 2019-07-23 DIAGNOSIS — N3281 Overactive bladder: Secondary | ICD-10-CM | POA: Diagnosis not present

## 2019-07-23 DIAGNOSIS — I10 Essential (primary) hypertension: Secondary | ICD-10-CM | POA: Diagnosis not present

## 2019-07-23 DIAGNOSIS — G252 Other specified forms of tremor: Secondary | ICD-10-CM | POA: Diagnosis not present

## 2019-07-23 DIAGNOSIS — Z299 Encounter for prophylactic measures, unspecified: Secondary | ICD-10-CM | POA: Diagnosis not present

## 2019-07-23 DIAGNOSIS — G500 Trigeminal neuralgia: Secondary | ICD-10-CM | POA: Diagnosis not present

## 2019-07-27 DIAGNOSIS — Z1231 Encounter for screening mammogram for malignant neoplasm of breast: Secondary | ICD-10-CM | POA: Diagnosis not present

## 2019-08-15 DIAGNOSIS — I10 Essential (primary) hypertension: Secondary | ICD-10-CM | POA: Diagnosis not present

## 2019-08-15 DIAGNOSIS — E7800 Pure hypercholesterolemia, unspecified: Secondary | ICD-10-CM | POA: Diagnosis not present

## 2019-10-28 DIAGNOSIS — E7800 Pure hypercholesterolemia, unspecified: Secondary | ICD-10-CM | POA: Diagnosis not present

## 2019-10-28 DIAGNOSIS — I10 Essential (primary) hypertension: Secondary | ICD-10-CM | POA: Diagnosis not present

## 2019-11-16 DIAGNOSIS — I10 Essential (primary) hypertension: Secondary | ICD-10-CM | POA: Diagnosis not present

## 2019-12-14 DIAGNOSIS — I10 Essential (primary) hypertension: Secondary | ICD-10-CM | POA: Diagnosis not present

## 2019-12-14 DIAGNOSIS — R609 Edema, unspecified: Secondary | ICD-10-CM | POA: Diagnosis not present

## 2019-12-14 DIAGNOSIS — I8393 Asymptomatic varicose veins of bilateral lower extremities: Secondary | ICD-10-CM | POA: Diagnosis not present

## 2019-12-14 DIAGNOSIS — Z299 Encounter for prophylactic measures, unspecified: Secondary | ICD-10-CM | POA: Diagnosis not present

## 2019-12-14 DIAGNOSIS — I739 Peripheral vascular disease, unspecified: Secondary | ICD-10-CM | POA: Diagnosis not present

## 2019-12-16 DIAGNOSIS — E7800 Pure hypercholesterolemia, unspecified: Secondary | ICD-10-CM | POA: Diagnosis not present

## 2019-12-16 DIAGNOSIS — I10 Essential (primary) hypertension: Secondary | ICD-10-CM | POA: Diagnosis not present

## 2019-12-27 DIAGNOSIS — I70211 Atherosclerosis of native arteries of extremities with intermittent claudication, right leg: Secondary | ICD-10-CM | POA: Diagnosis not present

## 2019-12-27 DIAGNOSIS — R600 Localized edema: Secondary | ICD-10-CM | POA: Diagnosis not present

## 2019-12-27 DIAGNOSIS — Z0389 Encounter for observation for other suspected diseases and conditions ruled out: Secondary | ICD-10-CM | POA: Diagnosis not present

## 2020-01-01 DIAGNOSIS — Z79899 Other long term (current) drug therapy: Secondary | ICD-10-CM | POA: Diagnosis not present

## 2020-01-01 DIAGNOSIS — M79662 Pain in left lower leg: Secondary | ICD-10-CM | POA: Diagnosis not present

## 2020-01-01 DIAGNOSIS — I10 Essential (primary) hypertension: Secondary | ICD-10-CM | POA: Diagnosis not present

## 2020-01-01 DIAGNOSIS — M79605 Pain in left leg: Secondary | ICD-10-CM | POA: Diagnosis not present

## 2020-01-03 DIAGNOSIS — R600 Localized edema: Secondary | ICD-10-CM | POA: Diagnosis not present

## 2020-01-03 DIAGNOSIS — M79605 Pain in left leg: Secondary | ICD-10-CM | POA: Diagnosis not present

## 2020-01-07 DIAGNOSIS — I803 Phlebitis and thrombophlebitis of lower extremities, unspecified: Secondary | ICD-10-CM | POA: Diagnosis not present

## 2020-01-07 DIAGNOSIS — Z299 Encounter for prophylactic measures, unspecified: Secondary | ICD-10-CM | POA: Diagnosis not present

## 2020-01-07 DIAGNOSIS — I739 Peripheral vascular disease, unspecified: Secondary | ICD-10-CM | POA: Diagnosis not present

## 2020-01-07 DIAGNOSIS — I10 Essential (primary) hypertension: Secondary | ICD-10-CM | POA: Diagnosis not present

## 2020-01-07 DIAGNOSIS — I8393 Asymptomatic varicose veins of bilateral lower extremities: Secondary | ICD-10-CM | POA: Diagnosis not present

## 2020-01-14 DIAGNOSIS — I10 Essential (primary) hypertension: Secondary | ICD-10-CM | POA: Diagnosis not present

## 2020-01-14 DIAGNOSIS — E7800 Pure hypercholesterolemia, unspecified: Secondary | ICD-10-CM | POA: Diagnosis not present

## 2020-01-15 DIAGNOSIS — I10 Essential (primary) hypertension: Secondary | ICD-10-CM | POA: Diagnosis not present

## 2020-02-01 DIAGNOSIS — Z23 Encounter for immunization: Secondary | ICD-10-CM | POA: Diagnosis not present

## 2020-02-15 DIAGNOSIS — E7800 Pure hypercholesterolemia, unspecified: Secondary | ICD-10-CM | POA: Diagnosis not present

## 2020-02-15 DIAGNOSIS — I10 Essential (primary) hypertension: Secondary | ICD-10-CM | POA: Diagnosis not present

## 2020-03-06 ENCOUNTER — Other Ambulatory Visit (HOSPITAL_COMMUNITY): Payer: Self-pay | Admitting: Internal Medicine

## 2020-03-06 ENCOUNTER — Ambulatory Visit (HOSPITAL_COMMUNITY)
Admission: RE | Admit: 2020-03-06 | Discharge: 2020-03-06 | Disposition: A | Discharge status: Home / Self Care | Payer: Medicare PPO | Source: Ambulatory Visit | Attending: Internal Medicine | Admitting: Internal Medicine

## 2020-03-06 ENCOUNTER — Other Ambulatory Visit: Payer: Self-pay

## 2020-03-06 DIAGNOSIS — I10 Essential (primary) hypertension: Secondary | ICD-10-CM | POA: Diagnosis not present

## 2020-03-06 DIAGNOSIS — I8391 Asymptomatic varicose veins of right lower extremity: Secondary | ICD-10-CM | POA: Insufficient documentation

## 2020-03-06 DIAGNOSIS — I8393 Asymptomatic varicose veins of bilateral lower extremities: Secondary | ICD-10-CM | POA: Diagnosis not present

## 2020-03-06 DIAGNOSIS — M7989 Other specified soft tissue disorders: Secondary | ICD-10-CM | POA: Diagnosis not present

## 2020-03-06 DIAGNOSIS — I803 Phlebitis and thrombophlebitis of lower extremities, unspecified: Secondary | ICD-10-CM | POA: Diagnosis not present

## 2020-03-06 DIAGNOSIS — M79604 Pain in right leg: Secondary | ICD-10-CM | POA: Diagnosis not present

## 2020-03-06 DIAGNOSIS — I82812 Embolism and thrombosis of superficial veins of left lower extremities: Secondary | ICD-10-CM | POA: Diagnosis not present

## 2020-03-06 DIAGNOSIS — Z299 Encounter for prophylactic measures, unspecified: Secondary | ICD-10-CM | POA: Diagnosis not present

## 2020-03-15 DIAGNOSIS — Z6838 Body mass index (BMI) 38.0-38.9, adult: Secondary | ICD-10-CM | POA: Diagnosis not present

## 2020-03-15 DIAGNOSIS — I803 Phlebitis and thrombophlebitis of lower extremities, unspecified: Secondary | ICD-10-CM | POA: Diagnosis not present

## 2020-03-15 DIAGNOSIS — Z299 Encounter for prophylactic measures, unspecified: Secondary | ICD-10-CM | POA: Diagnosis not present

## 2020-03-15 DIAGNOSIS — I10 Essential (primary) hypertension: Secondary | ICD-10-CM | POA: Diagnosis not present

## 2020-03-15 DIAGNOSIS — E7800 Pure hypercholesterolemia, unspecified: Secondary | ICD-10-CM | POA: Diagnosis not present

## 2020-03-15 DIAGNOSIS — J439 Emphysema, unspecified: Secondary | ICD-10-CM | POA: Diagnosis not present

## 2020-03-16 DIAGNOSIS — I10 Essential (primary) hypertension: Secondary | ICD-10-CM | POA: Diagnosis not present

## 2020-03-16 DIAGNOSIS — E7800 Pure hypercholesterolemia, unspecified: Secondary | ICD-10-CM | POA: Diagnosis not present

## 2020-03-27 DIAGNOSIS — Z0389 Encounter for observation for other suspected diseases and conditions ruled out: Secondary | ICD-10-CM | POA: Diagnosis not present

## 2020-03-27 DIAGNOSIS — K922 Gastrointestinal hemorrhage, unspecified: Secondary | ICD-10-CM | POA: Diagnosis not present

## 2020-03-27 DIAGNOSIS — R55 Syncope and collapse: Secondary | ICD-10-CM | POA: Diagnosis not present

## 2020-03-27 DIAGNOSIS — D500 Iron deficiency anemia secondary to blood loss (chronic): Secondary | ICD-10-CM | POA: Diagnosis not present

## 2020-03-27 DIAGNOSIS — G319 Degenerative disease of nervous system, unspecified: Secondary | ICD-10-CM | POA: Diagnosis not present

## 2020-03-27 DIAGNOSIS — J189 Pneumonia, unspecified organism: Secondary | ICD-10-CM | POA: Diagnosis not present

## 2020-03-27 DIAGNOSIS — I2510 Atherosclerotic heart disease of native coronary artery without angina pectoris: Secondary | ICD-10-CM | POA: Diagnosis not present

## 2020-03-27 DIAGNOSIS — K5900 Constipation, unspecified: Secondary | ICD-10-CM | POA: Diagnosis not present

## 2020-03-27 DIAGNOSIS — J939 Pneumothorax, unspecified: Secondary | ICD-10-CM | POA: Diagnosis not present

## 2020-03-27 DIAGNOSIS — Z20822 Contact with and (suspected) exposure to covid-19: Secondary | ICD-10-CM | POA: Diagnosis not present

## 2020-03-27 DIAGNOSIS — R519 Headache, unspecified: Secondary | ICD-10-CM | POA: Diagnosis not present

## 2020-03-27 DIAGNOSIS — I700 Atherosclerosis of aorta: Secondary | ICD-10-CM | POA: Diagnosis not present

## 2020-03-27 DIAGNOSIS — R42 Dizziness and giddiness: Secondary | ICD-10-CM | POA: Diagnosis not present

## 2020-03-27 DIAGNOSIS — I82409 Acute embolism and thrombosis of unspecified deep veins of unspecified lower extremity: Secondary | ICD-10-CM | POA: Diagnosis not present

## 2020-03-27 DIAGNOSIS — I10 Essential (primary) hypertension: Secondary | ICD-10-CM | POA: Diagnosis not present

## 2020-03-27 DIAGNOSIS — R9082 White matter disease, unspecified: Secondary | ICD-10-CM | POA: Diagnosis not present

## 2020-03-27 DIAGNOSIS — I708 Atherosclerosis of other arteries: Secondary | ICD-10-CM | POA: Diagnosis not present

## 2020-03-27 DIAGNOSIS — R0902 Hypoxemia: Secondary | ICD-10-CM | POA: Diagnosis not present

## 2020-03-27 DIAGNOSIS — R52 Pain, unspecified: Secondary | ICD-10-CM | POA: Diagnosis not present

## 2020-03-27 DIAGNOSIS — Z7901 Long term (current) use of anticoagulants: Secondary | ICD-10-CM | POA: Diagnosis not present

## 2020-03-27 DIAGNOSIS — R9431 Abnormal electrocardiogram [ECG] [EKG]: Secondary | ICD-10-CM | POA: Diagnosis not present

## 2020-04-02 DIAGNOSIS — K5900 Constipation, unspecified: Secondary | ICD-10-CM | POA: Diagnosis not present

## 2020-04-03 DIAGNOSIS — K449 Diaphragmatic hernia without obstruction or gangrene: Secondary | ICD-10-CM | POA: Diagnosis not present

## 2020-04-03 DIAGNOSIS — Z79899 Other long term (current) drug therapy: Secondary | ICD-10-CM | POA: Diagnosis not present

## 2020-04-03 DIAGNOSIS — K2950 Unspecified chronic gastritis without bleeding: Secondary | ICD-10-CM | POA: Diagnosis not present

## 2020-04-03 DIAGNOSIS — K921 Melena: Secondary | ICD-10-CM | POA: Diagnosis not present

## 2020-04-03 DIAGNOSIS — Z86718 Personal history of other venous thrombosis and embolism: Secondary | ICD-10-CM | POA: Diagnosis not present

## 2020-04-03 DIAGNOSIS — I10 Essential (primary) hypertension: Secondary | ICD-10-CM | POA: Diagnosis not present

## 2020-04-03 DIAGNOSIS — Z20822 Contact with and (suspected) exposure to covid-19: Secondary | ICD-10-CM | POA: Diagnosis not present

## 2020-04-04 DIAGNOSIS — K921 Melena: Secondary | ICD-10-CM | POA: Diagnosis not present

## 2020-04-04 DIAGNOSIS — Z9089 Acquired absence of other organs: Secondary | ICD-10-CM | POA: Diagnosis not present

## 2020-04-04 DIAGNOSIS — I10 Essential (primary) hypertension: Secondary | ICD-10-CM | POA: Diagnosis not present

## 2020-04-04 DIAGNOSIS — D62 Acute posthemorrhagic anemia: Secondary | ICD-10-CM | POA: Diagnosis not present

## 2020-04-04 DIAGNOSIS — R079 Chest pain, unspecified: Secondary | ICD-10-CM | POA: Diagnosis not present

## 2020-04-04 DIAGNOSIS — Z86718 Personal history of other venous thrombosis and embolism: Secondary | ICD-10-CM | POA: Diagnosis not present

## 2020-04-05 DIAGNOSIS — K449 Diaphragmatic hernia without obstruction or gangrene: Secondary | ICD-10-CM | POA: Diagnosis not present

## 2020-04-05 DIAGNOSIS — D62 Acute posthemorrhagic anemia: Secondary | ICD-10-CM | POA: Diagnosis not present

## 2020-04-05 DIAGNOSIS — I8001 Phlebitis and thrombophlebitis of superficial vessels of right lower extremity: Secondary | ICD-10-CM | POA: Diagnosis not present

## 2020-04-05 DIAGNOSIS — K921 Melena: Secondary | ICD-10-CM | POA: Diagnosis not present

## 2020-04-05 DIAGNOSIS — K922 Gastrointestinal hemorrhage, unspecified: Secondary | ICD-10-CM | POA: Diagnosis not present

## 2020-04-05 DIAGNOSIS — K31811 Angiodysplasia of stomach and duodenum with bleeding: Secondary | ICD-10-CM | POA: Diagnosis not present

## 2020-04-05 DIAGNOSIS — K3189 Other diseases of stomach and duodenum: Secondary | ICD-10-CM | POA: Diagnosis not present

## 2020-04-05 DIAGNOSIS — K5521 Angiodysplasia of colon with hemorrhage: Secondary | ICD-10-CM | POA: Diagnosis not present

## 2020-04-05 DIAGNOSIS — K2970 Gastritis, unspecified, without bleeding: Secondary | ICD-10-CM | POA: Diagnosis not present

## 2020-04-12 DIAGNOSIS — Z299 Encounter for prophylactic measures, unspecified: Secondary | ICD-10-CM | POA: Diagnosis not present

## 2020-04-12 DIAGNOSIS — I739 Peripheral vascular disease, unspecified: Secondary | ICD-10-CM | POA: Diagnosis not present

## 2020-04-12 DIAGNOSIS — J439 Emphysema, unspecified: Secondary | ICD-10-CM | POA: Diagnosis not present

## 2020-04-12 DIAGNOSIS — Z09 Encounter for follow-up examination after completed treatment for conditions other than malignant neoplasm: Secondary | ICD-10-CM | POA: Diagnosis not present

## 2020-04-12 DIAGNOSIS — K922 Gastrointestinal hemorrhage, unspecified: Secondary | ICD-10-CM | POA: Diagnosis not present

## 2020-04-12 DIAGNOSIS — I10 Essential (primary) hypertension: Secondary | ICD-10-CM | POA: Diagnosis not present

## 2020-04-17 DIAGNOSIS — G252 Other specified forms of tremor: Secondary | ICD-10-CM | POA: Diagnosis not present

## 2020-04-17 DIAGNOSIS — I809 Phlebitis and thrombophlebitis of unspecified site: Secondary | ICD-10-CM | POA: Diagnosis not present

## 2020-04-17 DIAGNOSIS — E7800 Pure hypercholesterolemia, unspecified: Secondary | ICD-10-CM | POA: Diagnosis not present

## 2020-04-17 DIAGNOSIS — I10 Essential (primary) hypertension: Secondary | ICD-10-CM | POA: Diagnosis not present

## 2020-04-24 DIAGNOSIS — I8001 Phlebitis and thrombophlebitis of superficial vessels of right lower extremity: Secondary | ICD-10-CM | POA: Diagnosis not present

## 2020-04-24 DIAGNOSIS — R079 Chest pain, unspecified: Secondary | ICD-10-CM | POA: Diagnosis not present

## 2020-04-24 DIAGNOSIS — I83813 Varicose veins of bilateral lower extremities with pain: Secondary | ICD-10-CM | POA: Diagnosis not present

## 2020-05-15 DIAGNOSIS — Z1339 Encounter for screening examination for other mental health and behavioral disorders: Secondary | ICD-10-CM | POA: Diagnosis not present

## 2020-05-15 DIAGNOSIS — E7800 Pure hypercholesterolemia, unspecified: Secondary | ICD-10-CM | POA: Diagnosis not present

## 2020-05-15 DIAGNOSIS — Z79899 Other long term (current) drug therapy: Secondary | ICD-10-CM | POA: Diagnosis not present

## 2020-05-15 DIAGNOSIS — I10 Essential (primary) hypertension: Secondary | ICD-10-CM | POA: Diagnosis not present

## 2020-05-15 DIAGNOSIS — Z7189 Other specified counseling: Secondary | ICD-10-CM | POA: Diagnosis not present

## 2020-05-15 DIAGNOSIS — Z789 Other specified health status: Secondary | ICD-10-CM | POA: Diagnosis not present

## 2020-05-15 DIAGNOSIS — Z0000 Encounter for general adult medical examination without abnormal findings: Secondary | ICD-10-CM | POA: Diagnosis not present

## 2020-05-15 DIAGNOSIS — Z1331 Encounter for screening for depression: Secondary | ICD-10-CM | POA: Diagnosis not present

## 2020-05-15 DIAGNOSIS — Z6837 Body mass index (BMI) 37.0-37.9, adult: Secondary | ICD-10-CM | POA: Diagnosis not present

## 2020-05-15 DIAGNOSIS — R5383 Other fatigue: Secondary | ICD-10-CM | POA: Diagnosis not present

## 2020-05-15 DIAGNOSIS — Z299 Encounter for prophylactic measures, unspecified: Secondary | ICD-10-CM | POA: Diagnosis not present

## 2020-05-25 DIAGNOSIS — Z8739 Personal history of other diseases of the musculoskeletal system and connective tissue: Secondary | ICD-10-CM | POA: Diagnosis not present

## 2020-05-25 DIAGNOSIS — M1710 Unilateral primary osteoarthritis, unspecified knee: Secondary | ICD-10-CM | POA: Diagnosis not present

## 2020-05-25 DIAGNOSIS — M2550 Pain in unspecified joint: Secondary | ICD-10-CM | POA: Diagnosis not present

## 2020-05-29 DIAGNOSIS — I83813 Varicose veins of bilateral lower extremities with pain: Secondary | ICD-10-CM | POA: Diagnosis not present

## 2020-05-29 DIAGNOSIS — M25571 Pain in right ankle and joints of right foot: Secondary | ICD-10-CM | POA: Diagnosis not present

## 2020-05-29 DIAGNOSIS — M79661 Pain in right lower leg: Secondary | ICD-10-CM | POA: Diagnosis not present

## 2020-05-29 DIAGNOSIS — Z8672 Personal history of thrombophlebitis: Secondary | ICD-10-CM | POA: Diagnosis not present

## 2020-05-29 DIAGNOSIS — I10 Essential (primary) hypertension: Secondary | ICD-10-CM | POA: Diagnosis not present

## 2020-05-29 DIAGNOSIS — I872 Venous insufficiency (chronic) (peripheral): Secondary | ICD-10-CM | POA: Diagnosis not present

## 2020-05-29 DIAGNOSIS — I8001 Phlebitis and thrombophlebitis of superficial vessels of right lower extremity: Secondary | ICD-10-CM | POA: Diagnosis not present

## 2020-06-14 DIAGNOSIS — E7800 Pure hypercholesterolemia, unspecified: Secondary | ICD-10-CM | POA: Diagnosis not present

## 2020-06-14 DIAGNOSIS — E1165 Type 2 diabetes mellitus with hyperglycemia: Secondary | ICD-10-CM | POA: Diagnosis not present

## 2020-06-14 DIAGNOSIS — I809 Phlebitis and thrombophlebitis of unspecified site: Secondary | ICD-10-CM | POA: Diagnosis not present

## 2020-06-14 DIAGNOSIS — G252 Other specified forms of tremor: Secondary | ICD-10-CM | POA: Diagnosis not present

## 2020-06-15 DIAGNOSIS — I10 Essential (primary) hypertension: Secondary | ICD-10-CM | POA: Diagnosis not present

## 2020-07-14 DIAGNOSIS — I10 Essential (primary) hypertension: Secondary | ICD-10-CM | POA: Diagnosis not present

## 2020-07-26 DIAGNOSIS — I872 Venous insufficiency (chronic) (peripheral): Secondary | ICD-10-CM | POA: Diagnosis not present

## 2020-08-14 DIAGNOSIS — J449 Chronic obstructive pulmonary disease, unspecified: Secondary | ICD-10-CM | POA: Diagnosis not present

## 2020-08-14 DIAGNOSIS — I10 Essential (primary) hypertension: Secondary | ICD-10-CM | POA: Diagnosis not present

## 2020-08-15 DIAGNOSIS — I10 Essential (primary) hypertension: Secondary | ICD-10-CM | POA: Diagnosis not present

## 2020-08-21 ENCOUNTER — Other Ambulatory Visit: Payer: Self-pay | Admitting: Internal Medicine

## 2020-08-21 ENCOUNTER — Ambulatory Visit
Admission: RE | Admit: 2020-08-21 | Discharge: 2020-08-21 | Disposition: A | Discharge status: Home / Self Care | Payer: Medicare PPO | Source: Ambulatory Visit | Attending: Internal Medicine | Admitting: Internal Medicine

## 2020-08-21 DIAGNOSIS — Z1231 Encounter for screening mammogram for malignant neoplasm of breast: Secondary | ICD-10-CM

## 2020-09-14 DIAGNOSIS — J449 Chronic obstructive pulmonary disease, unspecified: Secondary | ICD-10-CM | POA: Diagnosis not present

## 2020-09-14 DIAGNOSIS — I10 Essential (primary) hypertension: Secondary | ICD-10-CM | POA: Diagnosis not present

## 2020-09-15 DIAGNOSIS — R051 Acute cough: Secondary | ICD-10-CM | POA: Diagnosis not present

## 2020-09-15 DIAGNOSIS — U071 COVID-19: Secondary | ICD-10-CM | POA: Diagnosis not present

## 2020-09-15 DIAGNOSIS — J069 Acute upper respiratory infection, unspecified: Secondary | ICD-10-CM | POA: Diagnosis not present

## 2020-10-03 DIAGNOSIS — Z299 Encounter for prophylactic measures, unspecified: Secondary | ICD-10-CM | POA: Diagnosis not present

## 2020-10-03 DIAGNOSIS — I10 Essential (primary) hypertension: Secondary | ICD-10-CM | POA: Diagnosis not present

## 2020-10-03 DIAGNOSIS — J069 Acute upper respiratory infection, unspecified: Secondary | ICD-10-CM | POA: Diagnosis not present

## 2020-10-13 DIAGNOSIS — I10 Essential (primary) hypertension: Secondary | ICD-10-CM | POA: Diagnosis not present

## 2020-10-15 DIAGNOSIS — I10 Essential (primary) hypertension: Secondary | ICD-10-CM | POA: Diagnosis not present

## 2020-10-15 DIAGNOSIS — J449 Chronic obstructive pulmonary disease, unspecified: Secondary | ICD-10-CM | POA: Diagnosis not present

## 2020-10-23 DIAGNOSIS — Z9889 Other specified postprocedural states: Secondary | ICD-10-CM | POA: Diagnosis not present

## 2020-10-23 DIAGNOSIS — I8001 Phlebitis and thrombophlebitis of superficial vessels of right lower extremity: Secondary | ICD-10-CM | POA: Diagnosis not present

## 2020-10-24 DIAGNOSIS — E041 Nontoxic single thyroid nodule: Secondary | ICD-10-CM | POA: Diagnosis not present

## 2020-10-24 DIAGNOSIS — I872 Venous insufficiency (chronic) (peripheral): Secondary | ICD-10-CM | POA: Diagnosis not present

## 2020-10-24 DIAGNOSIS — Z86718 Personal history of other venous thrombosis and embolism: Secondary | ICD-10-CM | POA: Diagnosis not present

## 2020-10-24 DIAGNOSIS — Z6838 Body mass index (BMI) 38.0-38.9, adult: Secondary | ICD-10-CM | POA: Diagnosis not present

## 2020-10-24 DIAGNOSIS — Z7982 Long term (current) use of aspirin: Secondary | ICD-10-CM | POA: Diagnosis not present

## 2020-10-24 DIAGNOSIS — I8001 Phlebitis and thrombophlebitis of superficial vessels of right lower extremity: Secondary | ICD-10-CM | POA: Diagnosis not present

## 2020-10-24 DIAGNOSIS — Z9049 Acquired absence of other specified parts of digestive tract: Secondary | ICD-10-CM | POA: Diagnosis not present

## 2020-10-24 DIAGNOSIS — E669 Obesity, unspecified: Secondary | ICD-10-CM | POA: Diagnosis not present

## 2020-10-24 DIAGNOSIS — I10 Essential (primary) hypertension: Secondary | ICD-10-CM | POA: Diagnosis not present

## 2020-10-24 DIAGNOSIS — I83891 Varicose veins of right lower extremities with other complications: Secondary | ICD-10-CM | POA: Diagnosis not present

## 2020-10-26 DIAGNOSIS — Z4889 Encounter for other specified surgical aftercare: Secondary | ICD-10-CM | POA: Diagnosis not present

## 2020-11-15 DIAGNOSIS — I10 Essential (primary) hypertension: Secondary | ICD-10-CM | POA: Diagnosis not present

## 2020-12-11 DIAGNOSIS — I83891 Varicose veins of right lower extremities with other complications: Secondary | ICD-10-CM | POA: Diagnosis not present

## 2020-12-11 DIAGNOSIS — I872 Venous insufficiency (chronic) (peripheral): Secondary | ICD-10-CM | POA: Diagnosis not present

## 2020-12-11 DIAGNOSIS — Z48812 Encounter for surgical aftercare following surgery on the circulatory system: Secondary | ICD-10-CM | POA: Diagnosis not present

## 2020-12-11 DIAGNOSIS — Z9889 Other specified postprocedural states: Secondary | ICD-10-CM | POA: Diagnosis not present

## 2020-12-11 DIAGNOSIS — Z09 Encounter for follow-up examination after completed treatment for conditions other than malignant neoplasm: Secondary | ICD-10-CM | POA: Diagnosis not present

## 2020-12-11 DIAGNOSIS — I10 Essential (primary) hypertension: Secondary | ICD-10-CM | POA: Diagnosis not present

## 2020-12-15 DIAGNOSIS — I10 Essential (primary) hypertension: Secondary | ICD-10-CM | POA: Diagnosis not present

## 2020-12-15 DIAGNOSIS — E7800 Pure hypercholesterolemia, unspecified: Secondary | ICD-10-CM | POA: Diagnosis not present

## 2021-01-04 DIAGNOSIS — Z23 Encounter for immunization: Secondary | ICD-10-CM | POA: Diagnosis not present

## 2021-01-12 DIAGNOSIS — Z299 Encounter for prophylactic measures, unspecified: Secondary | ICD-10-CM | POA: Diagnosis not present

## 2021-01-12 DIAGNOSIS — I10 Essential (primary) hypertension: Secondary | ICD-10-CM | POA: Diagnosis not present

## 2021-01-12 DIAGNOSIS — E039 Hypothyroidism, unspecified: Secondary | ICD-10-CM | POA: Diagnosis not present

## 2021-01-12 DIAGNOSIS — R531 Weakness: Secondary | ICD-10-CM | POA: Diagnosis not present

## 2021-01-12 DIAGNOSIS — R110 Nausea: Secondary | ICD-10-CM | POA: Diagnosis not present

## 2021-01-12 DIAGNOSIS — R42 Dizziness and giddiness: Secondary | ICD-10-CM | POA: Diagnosis not present

## 2021-01-12 DIAGNOSIS — Z6838 Body mass index (BMI) 38.0-38.9, adult: Secondary | ICD-10-CM | POA: Diagnosis not present

## 2021-01-15 DIAGNOSIS — I10 Essential (primary) hypertension: Secondary | ICD-10-CM | POA: Diagnosis not present

## 2021-02-12 DIAGNOSIS — R42 Dizziness and giddiness: Secondary | ICD-10-CM | POA: Diagnosis not present

## 2021-02-12 DIAGNOSIS — G500 Trigeminal neuralgia: Secondary | ICD-10-CM | POA: Diagnosis not present

## 2021-02-12 DIAGNOSIS — I10 Essential (primary) hypertension: Secondary | ICD-10-CM | POA: Diagnosis not present

## 2021-02-12 DIAGNOSIS — Z6838 Body mass index (BMI) 38.0-38.9, adult: Secondary | ICD-10-CM | POA: Diagnosis not present

## 2021-02-12 DIAGNOSIS — Z299 Encounter for prophylactic measures, unspecified: Secondary | ICD-10-CM | POA: Diagnosis not present

## 2021-02-14 DIAGNOSIS — I10 Essential (primary) hypertension: Secondary | ICD-10-CM | POA: Diagnosis not present

## 2021-02-14 DIAGNOSIS — E7800 Pure hypercholesterolemia, unspecified: Secondary | ICD-10-CM | POA: Diagnosis not present

## 2021-03-14 DIAGNOSIS — K5900 Constipation, unspecified: Secondary | ICD-10-CM | POA: Diagnosis not present

## 2021-03-14 DIAGNOSIS — Z299 Encounter for prophylactic measures, unspecified: Secondary | ICD-10-CM | POA: Diagnosis not present

## 2021-03-14 DIAGNOSIS — K5792 Diverticulitis of intestine, part unspecified, without perforation or abscess without bleeding: Secondary | ICD-10-CM | POA: Diagnosis not present

## 2021-03-14 DIAGNOSIS — K921 Melena: Secondary | ICD-10-CM | POA: Diagnosis not present

## 2021-03-14 DIAGNOSIS — I10 Essential (primary) hypertension: Secondary | ICD-10-CM | POA: Diagnosis not present

## 2021-03-16 DIAGNOSIS — I10 Essential (primary) hypertension: Secondary | ICD-10-CM | POA: Diagnosis not present

## 2021-03-21 ENCOUNTER — Encounter (INDEPENDENT_AMBULATORY_CARE_PROVIDER_SITE_OTHER): Payer: Self-pay | Admitting: *Deleted

## 2021-04-15 DIAGNOSIS — I10 Essential (primary) hypertension: Secondary | ICD-10-CM | POA: Diagnosis not present

## 2021-05-10 ENCOUNTER — Ambulatory Visit (INDEPENDENT_AMBULATORY_CARE_PROVIDER_SITE_OTHER): Payer: Medicare PPO | Admitting: Gastroenterology

## 2021-05-10 ENCOUNTER — Telehealth (INDEPENDENT_AMBULATORY_CARE_PROVIDER_SITE_OTHER): Payer: Self-pay

## 2021-05-10 ENCOUNTER — Other Ambulatory Visit: Payer: Self-pay

## 2021-05-10 ENCOUNTER — Encounter (INDEPENDENT_AMBULATORY_CARE_PROVIDER_SITE_OTHER): Payer: Self-pay | Admitting: Gastroenterology

## 2021-05-10 ENCOUNTER — Other Ambulatory Visit (INDEPENDENT_AMBULATORY_CARE_PROVIDER_SITE_OTHER): Payer: Self-pay

## 2021-05-10 ENCOUNTER — Encounter (INDEPENDENT_AMBULATORY_CARE_PROVIDER_SITE_OTHER): Payer: Self-pay

## 2021-05-10 VITALS — BP 140/59 | HR 61 | Temp 98.8°F | Ht 62.0 in | Wt 211.4 lb

## 2021-05-10 DIAGNOSIS — Z8719 Personal history of other diseases of the digestive system: Secondary | ICD-10-CM | POA: Diagnosis not present

## 2021-05-10 DIAGNOSIS — Z01812 Encounter for preprocedural laboratory examination: Secondary | ICD-10-CM

## 2021-05-10 DIAGNOSIS — K625 Hemorrhage of anus and rectum: Secondary | ICD-10-CM | POA: Diagnosis not present

## 2021-05-10 DIAGNOSIS — I10 Essential (primary) hypertension: Secondary | ICD-10-CM

## 2021-05-10 MED ORDER — PEG 3350-KCL-NA BICARB-NACL 420 G PO SOLR: 4000.0000 mL | ORAL | 0 refills | Status: DC

## 2021-05-10 NOTE — Progress Notes (Signed)
Referring Provider: Glenda Chroman, MD Primary Care Physician:  Glenda Chroman, MD Primary GI Physician: new  Chief Complaint  Patient presents with   Blood In Stools    New patient visit. Was referred for blood in stools.    HPI:   Kristin Barnes is a 81 y.o. female with past medical history of arthritis, DM, GERD, HLD, HTN, sleep apnea  Patient presenting today as new patient for rectal bleeding, constipation and suspected previous diverticulitis.   Patient seen by PCP in December for constipation and hematochezia, with some upper abdominal and L flank pain, she was started on cipro and flagyl empirically for suspected diverticulitis, though no imaging was completed at the time. She was advised to do liquid diet x2 days and preparation H for possible hemorrhoids.   Today, she states that previously she was having constipation and some pain under her L rib cage, she reports she has noticed some toilet tissue hematochezia occasionally with wiping around that time as well, though no melena or blood mixed in with stools. She completed cipro and flagyl and used preparation H, she reports that upper abdominal pain and bleeding resolved, though she continues to have constipation. She states that symptoms did not really feel like previous episodes of diverticulitis that she has had. She denies any episodes of nausea or vomiting. She reports that she is having 1 BM per day now but having to strain and noting usually only little stool balls. She is not taking anything for the constipation. She denies any further rectal bleeding or melena. She reports that her appetite is not great since having knee surgery last year, she often just does not feel hungry and She states that she is not doing much physical activity anymore due to her knee issues. She denies any weight loss. No dysphagia, odynophagia or early satiety.  She endorses reflux symptoms at times, when she eats something spicy. Is maintained on protonix  40mg  daily, and takes tums on occasion which helps. She does report more flatulence at times. States that she does eat a lot of veggies, has not taken anything for her gas symptoms. Usually symptoms will improve on their own or with a carbonated drink. No abdominal pain.    NSAID use:no NSAIDs Social hx: no etoh or tobacco Fam hx: no CRC or liver disease  Last Colonoscopy:08/12/18 [facility]-normal per patient Last Endoscopy:04/05/20 - Medium-sized hiatal hernia.   - Gastritis. Biopsied.  - A single non-bleeding angiodysplastic lesion in the duodenum. Treated with argon plasma coagulation (APC). - Erythematous duodenopathy.    Past Medical History:  Diagnosis Date   Arthritis    hands and finger   Diabetes mellitus without complication (HCC)    GERD (gastroesophageal reflux disease)    uses club soda   Hyperlipidemia    Hypertension    Sleep apnea    states she had a CPAP machine at one time but was picked up...over 81yrs ago    Past Surgical History:  Procedure Laterality Date   CHOLECYSTECTOMY     THYROIDECTOMY Right 08/19/2016   Procedure: RIGHT HEMI-THYROIDECTOMY;  Surgeon: Leta Baptist, MD;  Location: [place];  Service: ENT;  Laterality: Right;   TUBAL LIGATION      Current Outpatient Medications  Medication Sig Dispense Refill   aspirin EC 81 MG tablet Take 81 mg by mouth daily. Swallow whole.     atorvastatin (LIPITOR) 10 MG tablet Take 10 mg by mouth daily.  furosemide (LASIX) 20 MG tablet Take 20 mg by mouth daily.     gabapentin (NEURONTIN) 300 MG capsule Take by mouth 2 (two) times daily.     losartan (COZAAR) 25 MG tablet Take 25 mg by mouth daily.     oxybutynin (DITROPAN XL) 15 MG 24 hr tablet Take 15 mg by mouth at bedtime.     pantoprazole (PROTONIX) 40 MG tablet Take 40 mg by mouth daily.     primidone (MYSOLINE) 50 MG tablet Take by mouth daily.     No current facility-administered medications for this visit.    Allergies as of 05/10/2021    (No Known Allergies)    Family History  Problem Relation Age of Onset   Diabetes Mother    CVA Mother    CAD Father    Breast cancer Sister     Social History   Socioeconomic History   Marital status: Married    Spouse name: Not on file   Number of children: Not on file   Years of education: Not on file   Highest education level: Not on file  Occupational History   Not on file  Tobacco Use   Smoking status: Never   Smokeless tobacco: Never  Vaping Use   Vaping Use: Never used  Substance and Sexual Activity   Alcohol use: No   Drug use: No   Sexual activity: Not on file  Other Topics Concern   Not on file  Social History Narrative   Not on file   Social Determinants of Health   Financial Resource Strain: Not on file  Food Insecurity: Not on file  Transportation Needs: Not on file  Physical Activity: Not on file  Stress: Not on file  Social Connections: Not on file   Review of systems General: negative for malaise, night sweats, fever, chills, weight loss Neck: Negative for lumps, goiter, pain and significant neck swelling Resp: Negative for cough, wheezing, dyspnea at rest CV: Negative for chest pain, leg swelling, palpitations, orthopnea GI: denies melena, nausea, vomiting, diarrhea, dysphagia, odyonophagia, early satiety or unintentional weight loss. +constipation +flatulence +hematochezia  MSK: Negative for joint pain or swelling, back pain, and muscle pain. Derm: Negative for itching or rash Psych: Denies depression, anxiety, memory loss, confusion. No homicidal or suicidal ideation.  Heme: Negative for prolonged bleeding, bruising easily, and swollen nodes. Endocrine: Negative for cold or heat intolerance, polyuria, polydipsia and goiter. Neuro: negative for tremor, gait imbalance, syncope and seizures. The remainder of the review of systems is noncontributory.  Physical Exam: BP (!) 140/59 (BP Location: Right Arm, Patient Position: Sitting, Cuff  Size: Large)    Pulse 61    Temp 98.8 F (37.1 C) (Oral)    Ht 5\' 2"  (1.575 m)    Wt 211 lb 6.4 oz (95.9 kg)    BMI 38.67 kg/m  General:   Alert and oriented. No distress noted. Pleasant and cooperative.  Head:  Normocephalic and atraumatic. Eyes:  Conjuctiva clear without scleral icterus. Mouth:  Oral mucosa pink and moist. Good dentition. No lesions. Heart: Normal rate and rhythm, s1 and s2 heart sounds present.  Lungs: Clear lung sounds in all lobes. Respirations equal and unlabored. Abdomen:  +BS, soft, non-tender and non-distended. No rebound or guarding. No HSM or masses noted. Derm: No palmar erythema or jaundice Msk:  Symmetrical without gross deformities. Normal posture. Extremities:  Without edema. Neurologic:  Alert and  oriented x4 Psych:  Alert and cooperative. Normal mood and affect.  Invalid input(s): 6 MONTHS   ASSESSMENT: Kristin Barnes is a 81 y.o. female presenting today for rectal bleeding, constipation and previously suspected diverticulitis.  Toilet tissue hematochezia, constipation and L flank pain in December that was treated with cipro and flagyl by PCP for suspected diverticulitis, though no imaging was done at that time. She also was advised to use preparation H for suspected hemorrhoids. She reports previous episodes of diverticulitis in the past that did not feel like what she experienced in December, however, she completed abx course and used preparation H with resolution of rectal bleeding and pain. She states that she continues to have constipation, is not taking anything for this, having 1 BM per day but usually with hard little stool balls. Last colonoscopy was in 2020 but I cannot review the results. Given no imaging done at time of suspected diverticulitis and presence of previous rectal bleeding, we will proceed with Colonoscopy for further evaluation to rule out any other causes of her symptoms such as bleeding polyps or malignancy. I have advised to her stay  well hydrated with high fiber diet and start miralax for her constipation. Indications, risks and benefits of procedure discussed in detail with patient. Patient verbalized understanding and is in agreement to proceed with Colonoscopy at this time.    PLAN:  Schedule colonoscopy 2. Stay well hydrated 3. Diet high in fruits, veggies and whole grains 4. Start taking Miralax 1 capful every day for one week. If bowel movements do not improve, increase to 1 capful every 12 hours. If after two weeks there is no improvement, increase to 1 capful every 8 hours 5. Avoid greasy, spicy, fried foods, reflux precautions   Follow Up: TBD after procedures   Kitara Hebb L. Alver Sorrow, MSN, APRN, AGNP-C Adult-Gerontology Nurse Practitioner [facility]

## 2021-05-10 NOTE — H&P (View-Only) (Signed)
Referring Provider: Glenda Chroman, MD Primary Care Physician:  Glenda Chroman, MD Primary GI Physician: new  Chief Complaint  Patient presents with   Blood In Stools    New patient visit. Was referred for blood in stools.    HPI:   Kristin Barnes is a 81 y.o. female with past medical history of arthritis, DM, GERD, HLD, HTN, sleep apnea  Patient presenting today as new patient for rectal bleeding, constipation and suspected previous diverticulitis.   Patient seen by PCP in December for constipation and hematochezia, with some upper abdominal and L flank pain, she was started on cipro and flagyl empirically for suspected diverticulitis, though no imaging was completed at the time. She was advised to do liquid diet x2 days and preparation H for possible hemorrhoids.   Today, she states that previously she was having constipation and some pain under her L rib cage, she reports she has noticed some toilet tissue hematochezia occasionally with wiping around that time as well, though no melena or blood mixed in with stools. She completed cipro and flagyl and used preparation H, she reports that upper abdominal pain and bleeding resolved, though she continues to have constipation. She states that symptoms did not really feel like previous episodes of diverticulitis that she has had. She denies any episodes of nausea or vomiting. She reports that she is having 1 BM per day now but having to strain and noting usually only little stool balls. She is not taking anything for the constipation. She denies any further rectal bleeding or melena. She reports that her appetite is not great since having knee surgery last year, she often just does not feel hungry and She states that she is not doing much physical activity anymore due to her knee issues. She denies any weight loss. No dysphagia, odynophagia or early satiety.  She endorses reflux symptoms at times, when she eats something spicy. Is maintained on protonix  40mg  daily, and takes tums on occasion which helps. She does report more flatulence at times. States that she does eat a lot of veggies, has not taken anything for her gas symptoms. Usually symptoms will improve on their own or with a carbonated drink. No abdominal pain.    NSAID use:no NSAIDs Social hx: no etoh or tobacco Fam hx: no CRC or liver disease  Last Colonoscopy:08/12/18 [facility]-normal per patient Last Endoscopy:04/05/20 - Medium-sized hiatal hernia.   - Gastritis. Biopsied.  - A single non-bleeding angiodysplastic lesion in the duodenum. Treated with argon plasma coagulation (APC). - Erythematous duodenopathy.    Past Medical History:  Diagnosis Date   Arthritis    hands and finger   Diabetes mellitus without complication (HCC)    GERD (gastroesophageal reflux disease)    uses club soda   Hyperlipidemia    Hypertension    Sleep apnea    states she had a CPAP machine at one time but was picked up...over 46yrs ago    Past Surgical History:  Procedure Laterality Date   CHOLECYSTECTOMY     THYROIDECTOMY Right 08/19/2016   Procedure: RIGHT HEMI-THYROIDECTOMY;  Surgeon: Leta Baptist, MD;  Location: [place];  Service: ENT;  Laterality: Right;   TUBAL LIGATION      Current Outpatient Medications  Medication Sig Dispense Refill   aspirin EC 81 MG tablet Take 81 mg by mouth daily. Swallow whole.     atorvastatin (LIPITOR) 10 MG tablet Take 10 mg by mouth daily.  furosemide (LASIX) 20 MG tablet Take 20 mg by mouth daily.     gabapentin (NEURONTIN) 300 MG capsule Take by mouth 2 (two) times daily.     losartan (COZAAR) 25 MG tablet Take 25 mg by mouth daily.     oxybutynin (DITROPAN XL) 15 MG 24 hr tablet Take 15 mg by mouth at bedtime.     pantoprazole (PROTONIX) 40 MG tablet Take 40 mg by mouth daily.     primidone (MYSOLINE) 50 MG tablet Take by mouth daily.     No current facility-administered medications for this visit.    Allergies as of 05/10/2021    (No Known Allergies)    Family History  Problem Relation Age of Onset   Diabetes Mother    CVA Mother    CAD Father    Breast cancer Sister     Social History   Socioeconomic History   Marital status: Married    Spouse name: Not on file   Number of children: Not on file   Years of education: Not on file   Highest education level: Not on file  Occupational History   Not on file  Tobacco Use   Smoking status: Never   Smokeless tobacco: Never  Vaping Use   Vaping Use: Never used  Substance and Sexual Activity   Alcohol use: No   Drug use: No   Sexual activity: Not on file  Other Topics Concern   Not on file  Social History Narrative   Not on file   Social Determinants of Health   Financial Resource Strain: Not on file  Food Insecurity: Not on file  Transportation Needs: Not on file  Physical Activity: Not on file  Stress: Not on file  Social Connections: Not on file   Review of systems General: negative for malaise, night sweats, fever, chills, weight loss Neck: Negative for lumps, goiter, pain and significant neck swelling Resp: Negative for cough, wheezing, dyspnea at rest CV: Negative for chest pain, leg swelling, palpitations, orthopnea GI: denies melena, nausea, vomiting, diarrhea, dysphagia, odyonophagia, early satiety or unintentional weight loss. +constipation +flatulence +hematochezia  MSK: Negative for joint pain or swelling, back pain, and muscle pain. Derm: Negative for itching or rash Psych: Denies depression, anxiety, memory loss, confusion. No homicidal or suicidal ideation.  Heme: Negative for prolonged bleeding, bruising easily, and swollen nodes. Endocrine: Negative for cold or heat intolerance, polyuria, polydipsia and goiter. Neuro: negative for tremor, gait imbalance, syncope and seizures. The remainder of the review of systems is noncontributory.  Physical Exam: BP (!) 140/59 (BP Location: Right Arm, Patient Position: Sitting, Cuff  Size: Large)    Pulse 61    Temp 98.8 F (37.1 C) (Oral)    Ht 5\' 2"  (1.575 m)    Wt 211 lb 6.4 oz (95.9 kg)    BMI 38.67 kg/m  General:   Alert and oriented. No distress noted. Pleasant and cooperative.  Head:  Normocephalic and atraumatic. Eyes:  Conjuctiva clear without scleral icterus. Mouth:  Oral mucosa pink and moist. Good dentition. No lesions. Heart: Normal rate and rhythm, s1 and s2 heart sounds present.  Lungs: Clear lung sounds in all lobes. Respirations equal and unlabored. Abdomen:  +BS, soft, non-tender and non-distended. No rebound or guarding. No HSM or masses noted. Derm: No palmar erythema or jaundice Msk:  Symmetrical without gross deformities. Normal posture. Extremities:  Without edema. Neurologic:  Alert and  oriented x4 Psych:  Alert and cooperative. Normal mood and affect.  Invalid input(s): 6 MONTHS   ASSESSMENT: Kristin Barnes is a 81 y.o. female presenting today for rectal bleeding, constipation and previously suspected diverticulitis.  Toilet tissue hematochezia, constipation and L flank pain in December that was treated with cipro and flagyl by PCP for suspected diverticulitis, though no imaging was done at that time. She also was advised to use preparation H for suspected hemorrhoids. She reports previous episodes of diverticulitis in the past that did not feel like what she experienced in December, however, she completed abx course and used preparation H with resolution of rectal bleeding and pain. She states that she continues to have constipation, is not taking anything for this, having 1 BM per day but usually with hard little stool balls. Last colonoscopy was in 2020 but I cannot review the results. Given no imaging done at time of suspected diverticulitis and presence of previous rectal bleeding, we will proceed with Colonoscopy for further evaluation to rule out any other causes of her symptoms such as bleeding polyps or malignancy. I have advised to her stay  well hydrated with high fiber diet and start miralax for her constipation. Indications, risks and benefits of procedure discussed in detail with patient. Patient verbalized understanding and is in agreement to proceed with Colonoscopy at this time.    PLAN:  Schedule colonoscopy 2. Stay well hydrated 3. Diet high in fruits, veggies and whole grains 4. Start taking Miralax 1 capful every day for one week. If bowel movements do not improve, increase to 1 capful every 12 hours. If after two weeks there is no improvement, increase to 1 capful every 8 hours 5. Avoid greasy, spicy, fried foods, reflux precautions   Follow Up: TBD after procedures   Kristin Hathaway L. Alver Sorrow, MSN, APRN, AGNP-C Adult-Gerontology Nurse Practitioner [facility]

## 2021-05-10 NOTE — Telephone Encounter (Signed)
Kristin Barnes Ann Tyniah Kastens, CMA  ?

## 2021-05-10 NOTE — Patient Instructions (Signed)
We will get you scheduled for colonoscopy for further evaluation of your previous rectal bleeding and suspected diverticulitis. As far as the constipation, please make sure you are drinking plenty of water, eating a diet high in fruits, veggies and whole grains. Start taking Miralax 1 capful every day for one week. If bowel movements do not improve, increase to 1 capful every 12 hours. If after two weeks there is no improvement, increase to 1 capful every 8 hours

## 2021-05-11 NOTE — Patient Instructions (Signed)
Kristin Barnes  05/11/2021     @PREFPERIOPPHARMACY @   Your procedure is scheduled on  05/18/2021.   Report to [facility] at  1230 P.M.   Call this number if you have problems the morning of surgery:  [phone]   Remember:  Follow the diet and prep instructions given to you by the office.    DO NOT take any medications for diabetes the morning of your procedure.     Take these medicines the morning of surgery with A SIP OF WATER                          gabapentin, protonix.     Do not wear jewelry, make-up or nail polish.  Do not wear lotions, powders, or perfumes, or deodorant.  Do not shave 48 hours prior to surgery.  Men may shave face and neck.  Do not bring valuables to the hospital.  [facility] is not responsible for any belongings or valuables.  Contacts, dentures or bridgework may not be worn into surgery.  Leave your suitcase in the car.  After surgery it may be brought to your room.  For patients admitted to the hospital, discharge time will be determined by your treatment team.  Patients discharged the day of surgery will not be allowed to drive home and must have someone with them for 24 hours.    Special instructions:   DO NOT smoke tobacco or vape for 24 hours before your procedure.  Please read over the following fact sheets that you were given. Anesthesia Post-op Instructions and Care and Recovery After Surgery      Colonoscopy, Adult, Care After This sheet gives you information about how to care for yourself after your procedure. Your health care provider may also give you more specific instructions. If you have problems or questions, contact your health care provider. What can I expect after the procedure? After the procedure, it is common to have: A small amount of blood in your stool for 24 hours after the procedure. Some gas. Mild cramping or bloating of your abdomen. Follow these instructions at home: Eating and drinking  Drink  enough fluid to keep your urine pale yellow. Follow instructions from your health care provider about eating or drinking restrictions. Resume your normal diet as instructed by your health care provider. Avoid heavy or fried foods that are hard to digest. Activity Rest as told by your health care provider. Avoid sitting for a long time without moving. Get up to take short walks every 1-2 hours. This is important to improve blood flow and breathing. Ask for help if you feel weak or unsteady. Return to your normal activities as told by your health care provider. Ask your health care provider what activities are safe for you. Managing cramping and bloating  Try walking around when you have cramps or feel bloated. Apply heat to your abdomen as told by your health care provider. Use the heat source that your health care provider recommends, such as a moist heat pack or a heating pad. Place a towel between your skin and the heat source. Leave the heat on for 20-30 minutes. Remove the heat if your skin turns bright red. This is especially important if you are unable to feel pain, heat, or cold. You may have a greater risk of getting burned. General instructions If you were given a sedative during the procedure, it can affect you  for several hours. Do not drive or operate machinery until your health care provider says that it is safe. For the first 24 hours after the procedure: Do not sign important documents. Do not drink alcohol. Do your regular daily activities at a slower pace than normal. Eat soft foods that are easy to digest. Take over-the-counter and prescription medicines only as told by your health care provider. Keep all follow-up visits as told by your health care provider. This is important. Contact a health care provider if: You have blood in your stool 2-3 days after the procedure. Get help right away if you have: More than a small spotting of blood in your stool. Large blood clots  in your stool. Swelling of your abdomen. Nausea or vomiting. A fever. Increasing pain in your abdomen that is not relieved with medicine. Summary After the procedure, it is common to have a small amount of blood in your stool. You may also have mild cramping and bloating of your abdomen. If you were given a sedative during the procedure, it can affect you for several hours. Do not drive or operate machinery until your health care provider says that it is safe. Get help right away if you have a lot of blood in your stool, nausea or vomiting, a fever, or increased pain in your abdomen. This information is not intended to replace advice given to you by your health care provider. Make sure you discuss any questions you have with your health care provider. Document Revised: 01/08/2019 Document Reviewed: 09/28/2018 Elsevier Patient Education  [place] After This sheet gives you information about how to care for yourself after your procedure. Your health care provider may also give you more specific instructions. If you have problems or questions, contact your health care provider. What can I expect after the procedure? After the procedure, it is common to have: Tiredness. Forgetfulness about what happened after the procedure. Impaired judgment for important decisions. Nausea or vomiting. Some difficulty with balance. Follow these instructions at home: For the time period you were told by your health care provider:   Rest as needed. Do not participate in activities where you could fall or become injured. Do not drive or use machinery. Do not drink alcohol. Do not take sleeping pills or medicines that cause drowsiness. Do not make important decisions or sign legal documents. Do not take care of children on your own. Eating and drinking Follow the diet that is recommended by your health care provider. Drink enough fluid to keep your urine pale  yellow. If you vomit: Drink water, juice, or soup when you can drink without vomiting. Make sure you have little or no nausea before eating solid foods. General instructions Have a responsible adult stay with you for the time you are told. It is important to have someone help care for you until you are awake and alert. Take over-the-counter and prescription medicines only as told by your health care provider. If you have sleep apnea, surgery and certain medicines can increase your risk for breathing problems. Follow instructions from your health care provider about wearing your sleep device: Anytime you are sleeping, including during daytime naps. While taking prescription pain medicines, sleeping medicines, or medicines that make you drowsy. Avoid smoking. Keep all follow-up visits as told by your health care provider. This is important. Contact a health care provider if: You keep feeling nauseous or you keep vomiting. You feel light-headed. You are still sleepy or having  trouble with balance after 24 hours. You develop a rash. You have a fever. You have redness or swelling around the IV site. Get help right away if: You have trouble breathing. You have new-onset confusion at home. Summary For several hours after your procedure, you may feel tired. You may also be forgetful and have poor judgment. Have a responsible adult stay with you for the time you are told. It is important to have someone help care for you until you are awake and alert. Rest as told. Do not drive or operate machinery. Do not drink alcohol or take sleeping pills. Get help right away if you have trouble breathing, or if you suddenly become confused. This information is not intended to replace advice given to you by your health care provider. Make sure you discuss any questions you have with your health care provider. Document Revised: 11/18/2019 Document Reviewed: 02/04/2019 Elsevier Patient Education  2022 [employer].

## 2021-05-15 ENCOUNTER — Encounter (HOSPITAL_COMMUNITY): Payer: Self-pay

## 2021-05-15 ENCOUNTER — Other Ambulatory Visit: Payer: Self-pay

## 2021-05-15 ENCOUNTER — Encounter (HOSPITAL_COMMUNITY)
Admission: RE | Admit: 2021-05-15 | Discharge: 2021-05-15 | Disposition: A | Discharge status: Home / Self Care | Payer: Medicare PPO | Source: Ambulatory Visit | Attending: Gastroenterology | Admitting: Gastroenterology

## 2021-05-15 DIAGNOSIS — Z01818 Encounter for other preprocedural examination: Secondary | ICD-10-CM | POA: Insufficient documentation

## 2021-05-15 DIAGNOSIS — Z299 Encounter for prophylactic measures, unspecified: Secondary | ICD-10-CM | POA: Diagnosis not present

## 2021-05-15 DIAGNOSIS — Z79899 Other long term (current) drug therapy: Secondary | ICD-10-CM | POA: Diagnosis not present

## 2021-05-15 DIAGNOSIS — Z6837 Body mass index (BMI) 37.0-37.9, adult: Secondary | ICD-10-CM | POA: Diagnosis not present

## 2021-05-15 DIAGNOSIS — E7800 Pure hypercholesterolemia, unspecified: Secondary | ICD-10-CM | POA: Diagnosis not present

## 2021-05-15 DIAGNOSIS — Z1339 Encounter for screening examination for other mental health and behavioral disorders: Secondary | ICD-10-CM | POA: Diagnosis not present

## 2021-05-15 DIAGNOSIS — Z01812 Encounter for preprocedural laboratory examination: Secondary | ICD-10-CM

## 2021-05-15 DIAGNOSIS — E039 Hypothyroidism, unspecified: Secondary | ICD-10-CM | POA: Diagnosis not present

## 2021-05-15 DIAGNOSIS — Z1331 Encounter for screening for depression: Secondary | ICD-10-CM | POA: Diagnosis not present

## 2021-05-15 DIAGNOSIS — I10 Essential (primary) hypertension: Secondary | ICD-10-CM | POA: Insufficient documentation

## 2021-05-15 DIAGNOSIS — R5383 Other fatigue: Secondary | ICD-10-CM | POA: Diagnosis not present

## 2021-05-15 DIAGNOSIS — Z0000 Encounter for general adult medical examination without abnormal findings: Secondary | ICD-10-CM | POA: Diagnosis not present

## 2021-05-15 DIAGNOSIS — Z7189 Other specified counseling: Secondary | ICD-10-CM | POA: Diagnosis not present

## 2021-05-15 NOTE — Pre-Procedure Instructions (Signed)
Message sent to Lucendia Herrlich that patient did not show for her pre-op.

## 2021-05-18 ENCOUNTER — Ambulatory Visit (HOSPITAL_COMMUNITY): Payer: Medicare PPO | Admitting: Anesthesiology

## 2021-05-18 ENCOUNTER — Ambulatory Visit (HOSPITAL_BASED_OUTPATIENT_CLINIC_OR_DEPARTMENT_OTHER): Payer: Medicare PPO | Admitting: Anesthesiology

## 2021-05-18 ENCOUNTER — Encounter (HOSPITAL_COMMUNITY): Payer: Self-pay | Admitting: Gastroenterology

## 2021-05-18 ENCOUNTER — Ambulatory Visit (HOSPITAL_COMMUNITY)
Admission: RE | Admit: 2021-05-18 | Discharge: 2021-05-18 | Disposition: A | Discharge status: Home / Self Care | Payer: Medicare PPO | Attending: Gastroenterology | Admitting: Gastroenterology

## 2021-05-18 ENCOUNTER — Encounter (HOSPITAL_COMMUNITY)
Admission: RE | Disposition: A | Discharge status: Home / Self Care | Payer: Self-pay | Source: Home / Self Care | Attending: Gastroenterology

## 2021-05-18 DIAGNOSIS — I10 Essential (primary) hypertension: Secondary | ICD-10-CM | POA: Diagnosis not present

## 2021-05-18 DIAGNOSIS — D122 Benign neoplasm of ascending colon: Secondary | ICD-10-CM | POA: Diagnosis not present

## 2021-05-18 DIAGNOSIS — K629 Disease of anus and rectum, unspecified: Secondary | ICD-10-CM | POA: Diagnosis not present

## 2021-05-18 DIAGNOSIS — G4730 Sleep apnea, unspecified: Secondary | ICD-10-CM | POA: Diagnosis not present

## 2021-05-18 DIAGNOSIS — E119 Type 2 diabetes mellitus without complications: Secondary | ICD-10-CM | POA: Diagnosis not present

## 2021-05-18 DIAGNOSIS — K219 Gastro-esophageal reflux disease without esophagitis: Secondary | ICD-10-CM | POA: Diagnosis not present

## 2021-05-18 DIAGNOSIS — K5730 Diverticulosis of large intestine without perforation or abscess without bleeding: Secondary | ICD-10-CM | POA: Insufficient documentation

## 2021-05-18 DIAGNOSIS — E785 Hyperlipidemia, unspecified: Secondary | ICD-10-CM | POA: Diagnosis not present

## 2021-05-18 DIAGNOSIS — M1990 Unspecified osteoarthritis, unspecified site: Secondary | ICD-10-CM | POA: Diagnosis not present

## 2021-05-18 DIAGNOSIS — K625 Hemorrhage of anus and rectum: Secondary | ICD-10-CM | POA: Diagnosis not present

## 2021-05-18 DIAGNOSIS — K648 Other hemorrhoids: Secondary | ICD-10-CM | POA: Insufficient documentation

## 2021-05-18 DIAGNOSIS — Z01812 Encounter for preprocedural laboratory examination: Secondary | ICD-10-CM

## 2021-05-18 DIAGNOSIS — K529 Noninfective gastroenteritis and colitis, unspecified: Secondary | ICD-10-CM

## 2021-05-18 DIAGNOSIS — Z7984 Long term (current) use of oral hypoglycemic drugs: Secondary | ICD-10-CM | POA: Diagnosis not present

## 2021-05-18 DIAGNOSIS — K5900 Constipation, unspecified: Secondary | ICD-10-CM | POA: Insufficient documentation

## 2021-05-18 DIAGNOSIS — K635 Polyp of colon: Secondary | ICD-10-CM | POA: Diagnosis not present

## 2021-05-18 DIAGNOSIS — Z79899 Other long term (current) drug therapy: Secondary | ICD-10-CM | POA: Insufficient documentation

## 2021-05-18 HISTORY — PX: COLONOSCOPY WITH PROPOFOL: SHX5780

## 2021-05-18 HISTORY — PX: POLYPECTOMY: SHX5525

## 2021-05-18 SURGERY — COLONOSCOPY WITH PROPOFOL
Anesthesia: General

## 2021-05-18 MED ORDER — SODIUM CHLORIDE 0.9 % IV SOLN: INTRAVENOUS | Status: DC

## 2021-05-18 MED ORDER — LIDOCAINE HCL (CARDIAC) PF 100 MG/5ML IV SOSY
PREFILLED_SYRINGE | INTRAVENOUS | Status: DC | PRN
  Administered 2021-05-18: 50 mg via INTRAVENOUS

## 2021-05-18 MED ORDER — PROPOFOL 10 MG/ML IV BOLUS
INTRAVENOUS | Status: DC | PRN
  Administered 2021-05-18: 40 mg via INTRAVENOUS

## 2021-05-18 MED ORDER — LACTATED RINGERS IV SOLN: INTRAVENOUS | Status: DC

## 2021-05-18 MED ORDER — PROPOFOL 500 MG/50ML IV EMUL
INTRAVENOUS | Status: DC | PRN
Start: 2021-05-18 — End: 2021-05-18
  Administered 2021-05-18: 150 ug/kg/min via INTRAVENOUS

## 2021-05-18 NOTE — Transfer of Care (Signed)
Immediate Anesthesia Transfer of Care Note ? ?Patient: Kristin Barnes ? ?Procedure(s) Performed: COLONOSCOPY WITH PROPOFOL ?POLYPECTOMY ? ?Patient Location: PACU ? ?Anesthesia Type:MAC ? ?Level of Consciousness: awake, alert  and oriented ? ?Airway & Oxygen Therapy: Patient Spontanous Breathing ? ?Post-op Assessment: Report given to RN and Post -op Vital signs reviewed and stable ? ?Post vital signs: Reviewed and stable ? ?Last Vitals:  ?Vitals Value Taken Time  ?BP 123/69 05/18/21 1337  ?Temp 36.8 ?C 05/18/21 1337  ?Pulse 89 05/18/21 1337  ?Resp 16 05/18/21 1337  ?SpO2 97 % 05/18/21 1337  ? ? ?Last Pain:  ?Vitals:  ? 05/18/21 1337  ?TempSrc: Oral  ?PainSc: 0-No pain  ?   ? ?  ? ?Complications: No notable events documented. ?

## 2021-05-18 NOTE — Discharge Instructions (Signed)
You are being discharged to home.  Resume your previous diet.  We are waiting for your pathology results.  Your physician has indicated that a repeat colonoscopy is not recommended due to your current age (66 years or older) for screening purposes.  

## 2021-05-18 NOTE — Interval H&P Note (Signed)
History and Physical Interval Note: ? ?05/18/2021 ?12:46 PM ? ?Kristin Barnes  has presented today for surgery, with the diagnosis of rectal bleeding hx of diverticulitis.  The various methods of treatment have been discussed with the patient and family. After consideration of risks, benefits and other options for treatment, the patient has consented to  Procedure(s) with comments: ?COLONOSCOPY WITH PROPOFOL (N/A) - 200 as a surgical intervention.  The patient's history has been reviewed, patient examined, no change in status, stable for surgery.  I have reviewed the patient's chart and labs.  Questions were answered to the patient's satisfaction.   ? ? ?Kristin Barnes ? ? ?

## 2021-05-18 NOTE — Anesthesia Preprocedure Evaluation (Signed)
Anesthesia Evaluation  ?Patient identified by MRN, date of birth, ID band ?Patient awake ? ? ? ?Reviewed: ?Allergy & Precautions, NPO status , Patient's Chart, lab work & pertinent test results ? ?Airway ?Mallampati: II ? ?TM Distance: >3 FB ?Neck ROM: Full ? ? ? Dental ? ?(+) Dental Advisory Given, Edentulous Upper, Missing ?  ?Pulmonary ?sleep apnea ,  ?  ?Pulmonary exam normal ?breath sounds clear to auscultation ? ? ? ? ? ? Cardiovascular ?hypertension, Pt. on home beta blockers ?Normal cardiovascular exam ?Rhythm:Regular Rate:Normal ? ? ?  ?Neuro/Psych ?negative neurological ROS ? negative psych ROS  ? GI/Hepatic ?Neg liver ROS, GERD  Medicated and Controlled,  ?Endo/Other  ?diabetes, Well Controlled, Type 2, Oral Hypoglycemic Agents ? Renal/GU ?negative Renal ROS  ?negative genitourinary ?  ?Musculoskeletal ? ?(+) Arthritis , Osteoarthritis,   ? Abdominal ?  ?Peds ?negative pediatric ROS ?(+)  Hematology ?negative hematology ROS ?(+)   ?Anesthesia Other Findings ? ? Reproductive/Obstetrics ?negative OB ROS ? ?  ? ? ? ? ? ? ? ? ? ? ? ? ? ?  ?  ? ? ? ? ? ? ? ?Anesthesia Physical ?Anesthesia Plan ? ?ASA: 2 ? ?Anesthesia Plan: General  ? ?Post-op Pain Management: Minimal or no pain anticipated  ? ?Induction: Intravenous ? ?PONV Risk Score and Plan: TIVA ? ?Airway Management Planned: Nasal Cannula and Natural Airway ? ?Additional Equipment:  ? ?Intra-op Plan:  ? ?Post-operative Plan:  ? ?Informed Consent: I have reviewed the patients History and Physical, chart, labs and discussed the procedure including the risks, benefits and alternatives for the proposed anesthesia with the patient or authorized representative who has indicated his/her understanding and acceptance.  ? ? ? ?Dental advisory given ? ?Plan Discussed with: CRNA and Surgeon ? ?Anesthesia Plan Comments:   ? ? ? ? ? ?Anesthesia Quick Evaluation ? ?

## 2021-05-18 NOTE — Op Note (Addendum)
[facility] ?Patient Name: Kristin Barnes ?Procedure Date: 05/18/2021 12:16 PM ?MRN: [id number] ?Date of Birth: 02-16-1941 ?Attending MD: Maylon Peppers ,  ?CSN: [id number] ?Age: 81 ?Admit Type: Outpatient ?Procedure:                Colonoscopy ?Indications:              Rectal bleeding, Colitis, presumed infectious ?Providers:                Maylon Peppers, Hughie Closs RN, RN, Tammy  ?                          Vaught, RN, Aram Candela ?Referring MD:              ?Medicines:                Monitored Anesthesia Care ?Complications:            No immediate complications. ?Estimated Blood Loss:     Estimated blood loss: none. ?Procedure:                Pre-Anesthesia Assessment: ?                          - Prior to the procedure, a History and Physical  ?                          was performed, and patient medications, allergies  ?                          and sensitivities were reviewed. The patient's  ?                          tolerance of previous anesthesia was reviewed. ?                          - The risks and benefits of the procedure and the  ?                          sedation options and risks were discussed with the  ?                          patient. All questions were answered and informed  ?                          consent was obtained. ?                          - ASA Grade Assessment: III - A patient with severe  ?                          systemic disease. ?                          After obtaining informed consent, the colonoscope  ?                          was passed under direct vision. Throughout the  ?  procedure, the patient's blood pressure, pulse, and  ?                          oxygen saturations were monitored continuously. The  ?                          [id number]-HQ190L ([id number]) scope was introduced through  ?                          the anus and advanced to the the cecum, identified  ?                          by appendiceal orifice and ileocecal valve. The   ?                          colonoscopy was performed without difficulty. The  ?                          patient tolerated the procedure well. The quality  ?                          of the bowel preparation was excellent. ?Scope In: 1:17:36 PM ?Scope Out: 1:35:27 PM ?Scope Withdrawal Time: 0 hours 8 minutes 48 seconds  ?Total Procedure Duration: 0 hours 17 minutes 51 seconds  ?Findings: ?     The perianal and digital rectal examinations were normal. ?     A 3 mm polyp was found in the proximal ascending colon. The polyp was  ?     sessile. The polyp was removed with a cold snare. Resection and  ?     retrieval were complete. ?     Multiple medium-mouthed diverticula were found in the sigmoid colon. ?     Non-bleeding internal hemorrhoids were found during retroflexion. The  ?     hemorrhoids were small. ?Impression:               - One 3 mm polyp in the proximal ascending colon,  ?                          removed with a cold snare. Resected and retrieved. ?                          - Diverticulosis in the sigmoid colon. ?                          - Non-bleeding internal hemorrhoids. ?Moderate Sedation: ?     Per Anesthesia Care ?Recommendation:           - Discharge patient to home (ambulatory). ?                          - Resume previous diet. ?                          - Await pathology results. ?                          -  Repeat colonoscopy is not recommended due to  ?                          current age (4 years or older) for screening  ?                          purposes. ?Procedure Code(s):        --- Professional --- ?                          [phone], Colonoscopy, flexible; with removal of  ?                          tumor(s), polyp(s), or other lesion(s) by snare  ?                          technique ?Diagnosis Code(s):        --- Professional --- ?                          K63.5, Polyp of colon ?                          K64.8, Other hemorrhoids ?                          K62.5, Hemorrhage of anus and  rectum ?                          K52.9, Noninfective gastroenteritis and colitis,  ?                          unspecified ?                          K57.30, Diverticulosis of large intestine without  ?                          perforation or abscess without bleeding ?CPT copyright 2019 American Medical Association. All rights reserved. ?The codes documented in this report are preliminary and upon coder review may  ?be revised to meet current compliance requirements. ?Maylon Peppers, MD ?Maylon Peppers,  ?05/18/2021 1:42:14 PM ?This report has been signed electronically. ?Number of Addenda: 0 ?

## 2021-05-18 NOTE — Anesthesia Postprocedure Evaluation (Signed)
Anesthesia Post Note ? ?Patient: Kristin Barnes ? ?Procedure(s) Performed: COLONOSCOPY WITH PROPOFOL ?POLYPECTOMY ? ?Patient location during evaluation: Phase II ?Anesthesia Type: General ?Level of consciousness: awake and alert and oriented ?Pain management: pain level controlled ?Vital Signs Assessment: post-procedure vital signs reviewed and stable ?Respiratory status: spontaneous breathing, nonlabored ventilation and respiratory function stable ?Cardiovascular status: blood pressure returned to baseline and stable ?Postop Assessment: no apparent nausea or vomiting ?Anesthetic complications: no ? ? ?No notable events documented. ? ? ?Last Vitals:  ?Vitals:  ? 05/18/21 1254 05/18/21 1337  ?BP: (!) 134/58 123/69  ?Pulse: 66 89  ?Resp: 17 16  ?Temp: 36.9 ?C 36.8 ?C  ?SpO2: 95% 97%  ?  ?Last Pain:  ?Vitals:  ? 05/18/21 1337  ?TempSrc: Oral  ?PainSc: 0-No pain  ? ? ?  ?  ?  ?  ?  ?  ? ?[name] ? ? ? ? ?

## 2021-05-23 LAB — SURGICAL PATHOLOGY

## 2021-05-24 ENCOUNTER — Encounter (HOSPITAL_COMMUNITY): Payer: Self-pay | Admitting: Gastroenterology

## 2021-05-31 ENCOUNTER — Other Ambulatory Visit: Payer: Self-pay | Admitting: Internal Medicine

## 2021-06-14 DIAGNOSIS — I10 Essential (primary) hypertension: Secondary | ICD-10-CM | POA: Diagnosis not present

## 2021-07-15 DIAGNOSIS — I10 Essential (primary) hypertension: Secondary | ICD-10-CM | POA: Diagnosis not present

## 2021-07-15 DIAGNOSIS — E7800 Pure hypercholesterolemia, unspecified: Secondary | ICD-10-CM | POA: Diagnosis not present

## 2021-08-14 DIAGNOSIS — E860 Dehydration: Secondary | ICD-10-CM | POA: Diagnosis not present

## 2021-08-14 DIAGNOSIS — Z299 Encounter for prophylactic measures, unspecified: Secondary | ICD-10-CM | POA: Diagnosis not present

## 2021-08-14 DIAGNOSIS — I10 Essential (primary) hypertension: Secondary | ICD-10-CM | POA: Diagnosis not present

## 2021-08-14 DIAGNOSIS — Z6837 Body mass index (BMI) 37.0-37.9, adult: Secondary | ICD-10-CM | POA: Diagnosis not present

## 2021-08-14 DIAGNOSIS — Z713 Dietary counseling and surveillance: Secondary | ICD-10-CM | POA: Diagnosis not present

## 2021-08-29 ENCOUNTER — Ambulatory Visit
Admission: RE | Admit: 2021-08-29 | Discharge: 2021-08-29 | Disposition: A | Discharge status: Home / Self Care | Payer: Medicare PPO | Source: Ambulatory Visit | Attending: Internal Medicine | Admitting: Internal Medicine

## 2021-08-29 DIAGNOSIS — Z1231 Encounter for screening mammogram for malignant neoplasm of breast: Secondary | ICD-10-CM

## 2021-09-13 DIAGNOSIS — I10 Essential (primary) hypertension: Secondary | ICD-10-CM | POA: Diagnosis not present

## 2021-09-25 DIAGNOSIS — H35033 Hypertensive retinopathy, bilateral: Secondary | ICD-10-CM | POA: Diagnosis not present

## 2021-10-04 DIAGNOSIS — I10 Essential (primary) hypertension: Secondary | ICD-10-CM | POA: Diagnosis not present

## 2021-10-04 DIAGNOSIS — E538 Deficiency of other specified B group vitamins: Secondary | ICD-10-CM | POA: Diagnosis not present

## 2021-10-04 DIAGNOSIS — R5383 Other fatigue: Secondary | ICD-10-CM | POA: Diagnosis not present

## 2021-10-04 DIAGNOSIS — Z299 Encounter for prophylactic measures, unspecified: Secondary | ICD-10-CM | POA: Diagnosis not present

## 2021-10-04 DIAGNOSIS — R413 Other amnesia: Secondary | ICD-10-CM | POA: Diagnosis not present

## 2021-10-15 DIAGNOSIS — I10 Essential (primary) hypertension: Secondary | ICD-10-CM | POA: Diagnosis not present

## 2021-10-17 DIAGNOSIS — R413 Other amnesia: Secondary | ICD-10-CM | POA: Diagnosis not present

## 2021-11-01 DIAGNOSIS — I10 Essential (primary) hypertension: Secondary | ICD-10-CM | POA: Diagnosis not present

## 2021-11-01 DIAGNOSIS — R413 Other amnesia: Secondary | ICD-10-CM | POA: Diagnosis not present

## 2021-11-01 DIAGNOSIS — Z299 Encounter for prophylactic measures, unspecified: Secondary | ICD-10-CM | POA: Diagnosis not present

## 2021-11-14 DIAGNOSIS — I10 Essential (primary) hypertension: Secondary | ICD-10-CM | POA: Diagnosis not present

## 2021-12-14 DIAGNOSIS — I10 Essential (primary) hypertension: Secondary | ICD-10-CM | POA: Diagnosis not present

## 2021-12-22 DIAGNOSIS — Z7982 Long term (current) use of aspirin: Secondary | ICD-10-CM | POA: Diagnosis not present

## 2021-12-22 DIAGNOSIS — I10 Essential (primary) hypertension: Secondary | ICD-10-CM | POA: Diagnosis not present

## 2021-12-22 DIAGNOSIS — K921 Melena: Secondary | ICD-10-CM | POA: Diagnosis not present

## 2021-12-22 DIAGNOSIS — Z79899 Other long term (current) drug therapy: Secondary | ICD-10-CM | POA: Diagnosis not present

## 2021-12-26 DIAGNOSIS — R109 Unspecified abdominal pain: Secondary | ICD-10-CM | POA: Diagnosis not present

## 2021-12-26 DIAGNOSIS — I10 Essential (primary) hypertension: Secondary | ICD-10-CM | POA: Diagnosis not present

## 2021-12-26 DIAGNOSIS — Z299 Encounter for prophylactic measures, unspecified: Secondary | ICD-10-CM | POA: Diagnosis not present

## 2021-12-26 DIAGNOSIS — K921 Melena: Secondary | ICD-10-CM | POA: Diagnosis not present

## 2021-12-26 DIAGNOSIS — Z6837 Body mass index (BMI) 37.0-37.9, adult: Secondary | ICD-10-CM | POA: Diagnosis not present

## 2022-01-02 DIAGNOSIS — R1032 Left lower quadrant pain: Secondary | ICD-10-CM | POA: Diagnosis not present

## 2022-01-02 DIAGNOSIS — I700 Atherosclerosis of aorta: Secondary | ICD-10-CM | POA: Diagnosis not present

## 2022-01-03 ENCOUNTER — Encounter (INDEPENDENT_AMBULATORY_CARE_PROVIDER_SITE_OTHER): Payer: Self-pay | Admitting: Gastroenterology

## 2022-01-03 ENCOUNTER — Ambulatory Visit (INDEPENDENT_AMBULATORY_CARE_PROVIDER_SITE_OTHER): Payer: Medicare PPO | Admitting: Gastroenterology

## 2022-01-03 ENCOUNTER — Encounter (INDEPENDENT_AMBULATORY_CARE_PROVIDER_SITE_OTHER): Payer: Self-pay

## 2022-01-03 ENCOUNTER — Other Ambulatory Visit (INDEPENDENT_AMBULATORY_CARE_PROVIDER_SITE_OTHER): Payer: Self-pay

## 2022-01-03 VITALS — BP 134/59 | HR 59 | Temp 98.2°F | Ht 62.0 in | Wt 207.3 lb

## 2022-01-03 DIAGNOSIS — K921 Melena: Secondary | ICD-10-CM

## 2022-01-03 DIAGNOSIS — R748 Abnormal levels of other serum enzymes: Secondary | ICD-10-CM

## 2022-01-03 DIAGNOSIS — K625 Hemorrhage of anus and rectum: Secondary | ICD-10-CM | POA: Diagnosis not present

## 2022-01-03 MED ORDER — PANTOPRAZOLE SODIUM 40 MG PO TBEC: 40.0000 mg | DELAYED_RELEASE_TABLET | Freq: Every day | ORAL | 1 refills | Status: DC

## 2022-01-03 NOTE — Progress Notes (Unsigned)
Referring Provider: Glenda Chroman, MD Primary Care Physician:  Glenda Chroman, MD Primary GI Physician: Jenetta Downer   Chief Complaint  Patient presents with   Rectal Bleeding    Patient here today for a follow up on her rectal bleeding. She has seen bright red blood on the tissues when she wipes and she says the dark stools have subsided. She has occasionally has lower abdominal pain and has some occasional constipation.She drinks warm prune juice nightly. She is taking pantoprazole 40 mg once per day and she says it is not controlling symptoms as it should.   HPI:   Kristin Barnes is a 81 y.o. female with past medical history of arthritis, DM, GERD, HLD, HTN, sleep apnea  Patient presenting today for hospital follow up for melena.   Last seen February 2023, at that time presenting for rectal bleeding, constipation and suspected previous diverticulitis. At last OV she reported toilet tissue hematochezia, had previously completed cipro and flagyl for suspected diverticulitis and abdominal pain had resolved. Having 1 BM per day. Having some reflux symptoms when eating spicy foods, maintained on protonix '40mg'$  daily, taking tums on occasion.   Recommended scheduling colonoscopy, take miralax and reflux precautions.  Last labs 10/11 with hgb of 11.4 (appears to be around baseline), AP was elevated to 139, other LFTs WNL Recent ED visit to [facility] for Melena, positive FOBT, started on protonix '40mg'$  daily  Notably seen by PCP on 10/11 and started on cipro '500mg'$  BID x10 days, no imaging of abdomen done  Present: Patient states that she was having black stools previously that last about 2 weeks.  Last episode of melena was on Monday, noticed a streak of BRB at that time as well but no other BRB. She went to [facility] on 12/22/21 for evaluation of melena. She notes that she has had lower abdominal pain as well that began with the melena. Is trying to do more bland foods and soups. Denies diarrhea. Has some  constipation with hard stool balls at times. She is drinking prune juice which seems to help with constipation. Notably having about 2 BMs per day. Lower abdominal pain improves with a BM. Has some epigastric and RUQ pain. Denies any recent NSAID use. Has occasional GERD symptoms. Has been taking protonix daily since ED visit. No SOB, dizziness, lightheadedness or syncope.    Last Colonoscopy:05/18/21 One 3 mm polyp in the proximal ascending colon(tubular adenoma) - Diverticulosis in the sigmoid colon - Non-bleeding internal hemorrhoids. Last Endoscopy:04/05/20 - Medium-sized hiatal hernia.   - Gastritis. Biopsied.  - A single non-bleeding angiodysplastic lesion in the duodenum. Treated with argon plasma coagulation (APC). - Erythematous duodenopathy.   Recommendations:  No recommended repeat colonoscopy   Past Medical History:  Diagnosis Date   Arthritis    hands and finger   Diabetes mellitus without complication (HCC)    GERD (gastroesophageal reflux disease)    uses club soda   Hyperlipidemia    Hypertension    Sleep apnea    states she had a CPAP machine at one time but was picked up...over 34yr ago    Past Surgical History:  Procedure Laterality Date   CHOLECYSTECTOMY     COLONOSCOPY WITH PROPOFOL N/A 05/18/2021   Procedure: COLONOSCOPY WITH PROPOFOL;  Surgeon: CHarvel Quale MD;  [facility];  Service: Gastroenterology;  Laterality: N/A;  200   POLYPECTOMY  05/18/2021   Procedure: POLYPECTOMY;  Surgeon: CHarvel Quale MD;  [facility];  Service: Gastroenterology;;   THYROIDECTOMY Right 08/19/2016   Procedure: RIGHT HEMI-THYROIDECTOMY;  Surgeon: Leta Baptist, MD;  Location: [place];  Service: ENT;  Laterality: Right;   TUBAL LIGATION      Current Outpatient Medications  Medication Sig Dispense Refill   atorvastatin (LIPITOR) 10 MG tablet Take 10 mg by mouth daily.     furosemide (LASIX) 20 MG tablet Take 20 mg by  mouth daily.     gabapentin (NEURONTIN) 300 MG capsule Take by mouth 2 (two) times daily.     losartan (COZAAR) 100 MG tablet Take 100 mg by mouth daily.     oxybutynin (DITROPAN XL) 15 MG 24 hr tablet Take 15 mg by mouth at bedtime.     pantoprazole (PROTONIX) 40 MG tablet Take 40 mg by mouth daily.     primidone (MYSOLINE) 50 MG tablet Take by mouth daily.     No current facility-administered medications for this visit.    Allergies as of 01/03/2022   (No Known Allergies)    Family History  Problem Relation Age of Onset   Diabetes Mother    CVA Mother    CAD Father    Breast cancer Sister     Social History   Socioeconomic History   Marital status: Married    Spouse name: Not on file   Number of children: Not on file   Years of education: Not on file   Highest education level: Not on file  Occupational History   Not on file  Tobacco Use   Smoking status: Never   Smokeless tobacco: Never  Vaping Use   Vaping Use: Never used  Substance and Sexual Activity   Alcohol use: No   Drug use: No   Sexual activity: Yes  Other Topics Concern   Not on file  Social History Narrative   Not on file   Social Determinants of Health   Financial Resource Strain: Not on file  Food Insecurity: Not on file  Transportation Needs: Not on file  Physical Activity: Not on file  Stress: Not on file  Social Connections: Not on file   Review of systems General: negative for malaise, night sweats, fever, chills, weight loss Neck: Negative for lumps, goiter, pain and significant neck swelling Resp: Negative for cough, wheezing, dyspnea at rest CV: Negative for chest pain, leg swelling, palpitations, orthopnea GI: denies hematochezia, nausea, vomiting, diarrhea,  dysphagia, odyonophagia, early satiety or unintentional weight loss. +melena +upper abdominal pain +constipation  MSK: Negative for joint pain or swelling, back pain, and muscle pain. Derm: Negative for itching or rash Psych:  Denies depression, anxiety, memory loss, confusion. No homicidal or suicidal ideation.  Heme: Negative for prolonged bleeding, bruising easily, and swollen nodes. Endocrine: Negative for cold or heat intolerance, polyuria, polydipsia and goiter. Neuro: negative for tremor, gait imbalance, syncope and seizures. The remainder of the review of systems is noncontributory.  Physical Exam: BP (!) 134/59 (BP Location: Left Arm, Patient Position: Sitting, Cuff Size: Large)   Pulse (!) 59   Temp 98.2 F (36.8 C) (Oral)   Ht '5\' 2"'$  (1.575 m)   Wt 207 lb 4.8 oz (94 kg)   BMI 37.92 kg/m  General:   Alert and oriented. No distress noted. Pleasant and cooperative.  Head:  Normocephalic and atraumatic. Eyes:  Conjuctiva clear without scleral icterus. Mouth:  Oral mucosa pink and moist. Good dentition. No lesions. Heart: Normal rate and rhythm, s1 and s2 heart sounds present.  Lungs: Clear  lung sounds in all lobes. Respirations equal and unlabored. Abdomen:  +BS, soft, and non-distended. TTP of Epigastric and RUQ regions. No rebound or guarding. No HSM or masses noted. Derm: No palmar erythema or jaundice Msk:  Symmetrical without gross deformities. Normal posture. Extremities:  Without edema. Neurologic:  Alert and  oriented x4 Psych:  Alert and cooperative. Normal mood and affect.  Invalid input(s): "6 MONTHS"   ASSESSMENT: Kristin Barnes is a 81 y.o. female presenting today for melena.  Recent visit to [facility] for melena with +FOBT, hgb of 11. Denies symptoms of overt anemia at this time. Started on protonix '40mg'$  daily on 10/7, last episode of melena was on Monday. Notes some epigastric to RUQ pain. No recent NSAID use. Hx of angiodysplastic lesion in the duodenum on EGD in January 2022 when she was notably hospitalized with melena and anemia requiring transfusion. Recommend proceeding with repeat upper endoscopy as there are concerns for UGI bleeding, potentially from further angiodysplastic lesions,  though cannot rule out PUD, gastritis, duodenitis given her abdominal pain. Will continue with PPI daily for now. Indications, risks and benefits of procedure discussed in detail with patient. Patient verbalized understanding and is in agreement to proceed with EGD at this time.   Notably, she has had mild elevation of her Alkaline phosphatase for the past few months, other LFTs WNL, T bili has fluctuated with last values being AP 139 and T bili 0.5. given ongoing elevation, would recommend further evaluation to rule out presence of PBC with AMA, IgM and AP isoenzymes.   PLAN:  Continue PPI Daily 2. Schedule EGD-ASA III, ENDO 3  3. Continue to avoid NSAIDs 4. Pt to make me aware of worsening rectal bleeding, sob, lightheadedness or syncope 5. Further work up for elevated AP  All questions were answered, patient verbalized understanding and is in agreement with plan as outlined above.   Follow Up: 3 months   Klaudia Beirne L. Alver Sorrow, MSN, APRN, AGNP-C Adult-Gerontology Nurse Practitioner [facility]

## 2022-01-03 NOTE — Patient Instructions (Signed)
Please continue protonix '40mg'$  once daily, I have sent a refill of this We will schedule you for upper endoscopy as I am concerned for bleeding in the upper GI tract Please let me know if you have shortness of breath, feeling faint or you pass out as these could be signs of severe blood loss.   Follow up 3 months

## 2022-01-04 ENCOUNTER — Encounter (INDEPENDENT_AMBULATORY_CARE_PROVIDER_SITE_OTHER): Payer: Self-pay

## 2022-01-04 ENCOUNTER — Other Ambulatory Visit (INDEPENDENT_AMBULATORY_CARE_PROVIDER_SITE_OTHER): Payer: Self-pay

## 2022-01-06 DIAGNOSIS — K921 Melena: Secondary | ICD-10-CM | POA: Insufficient documentation

## 2022-01-06 DIAGNOSIS — R748 Abnormal levels of other serum enzymes: Secondary | ICD-10-CM | POA: Insufficient documentation

## 2022-01-07 DIAGNOSIS — R748 Abnormal levels of other serum enzymes: Secondary | ICD-10-CM | POA: Diagnosis not present

## 2022-01-13 LAB — MITOCHONDRIAL ANTIBODIES: Mitochondrial Ab: <20 Units (ref 0.0–20.0)

## 2022-01-13 LAB — IGM: IgM (Immunoglobulin M), Srm: 80 mg/dL (ref 26–217)

## 2022-01-13 LAB — ALKALINE PHOSPHATASE, ISOENZYMES
Alkaline Phosphatase: 116 IU/L (ref 44–121)
BONE FRACTION: 34 % (ref 14–68)
INTESTINAL FRAC.: 1 % (ref 0–18)
LIVER FRACTION: 65 % (ref 18–85)

## 2022-01-14 DIAGNOSIS — I10 Essential (primary) hypertension: Secondary | ICD-10-CM | POA: Diagnosis not present

## 2022-02-13 DIAGNOSIS — I10 Essential (primary) hypertension: Secondary | ICD-10-CM | POA: Diagnosis not present

## 2022-02-22 ENCOUNTER — Encounter (HOSPITAL_COMMUNITY): Payer: Self-pay

## 2022-02-22 ENCOUNTER — Encounter (HOSPITAL_COMMUNITY)
Admission: RE | Admit: 2022-02-22 | Discharge: 2022-02-22 | Disposition: A | Discharge status: Home / Self Care | Payer: Medicare PPO | Source: Ambulatory Visit | Attending: Gastroenterology | Admitting: Gastroenterology

## 2022-02-22 ENCOUNTER — Other Ambulatory Visit: Payer: Self-pay

## 2022-02-22 DIAGNOSIS — K921 Melena: Secondary | ICD-10-CM

## 2022-02-22 NOTE — Patient Instructions (Signed)
    Kristin Barnes  02/22/2022     '@PREFPERIOPPHARMACY'$ @   Your procedure is scheduled on  02/26/2022.   Report to [facility] at  [place].M.   Call this number if you have problems the morning of surgery:  [phone]  If you experience any cold or flu symptoms such as cough, fever, chills, shortness of breath, etc. between now and your scheduled surgery, please notify us at the above number.   Remember:  Follow the diet instructions given to you by the office.     Take these medicines the morning of surgery with A SIP OF WATER                                         gabapentin, pantoprazole, primidone.     Do not wear jewelry, make-up or nail polish.  Do not wear lotions, powders, or perfumes, or deodorant.  Do not shave 48 hours prior to surgery.  Men may shave face and neck.  Do not bring valuables to the hospital.  [facility] is not responsible for any belongings or valuables.  Contacts, dentures or bridgework may not be worn into surgery.  Leave your suitcase in the car.  After surgery it may be brought to your room.  For patients admitted to the hospital, discharge time will be determined by your treatment team.  Patients discharged the day of surgery will not be allowed to drive home and must have someone with them for 24 hours.    Special instructions:   DO NOT smoke tobacco or vape for 24 hours before your procedure.  Please read over the following fact sheets that you were given. Anesthesia Post-op Instructions and Care and Recovery After Surgery

## 2022-02-26 ENCOUNTER — Encounter (HOSPITAL_COMMUNITY): Payer: Self-pay | Admitting: Gastroenterology

## 2022-02-26 ENCOUNTER — Ambulatory Visit (HOSPITAL_COMMUNITY)
Admission: RE | Admit: 2022-02-26 | Discharge: 2022-02-26 | Disposition: A | Discharge status: Home / Self Care | Payer: Medicare PPO | Attending: Gastroenterology | Admitting: Gastroenterology

## 2022-02-26 ENCOUNTER — Encounter (HOSPITAL_COMMUNITY)
Admission: RE | Disposition: A | Discharge status: Home / Self Care | Payer: Self-pay | Source: Home / Self Care | Attending: Gastroenterology

## 2022-02-26 ENCOUNTER — Ambulatory Visit (HOSPITAL_BASED_OUTPATIENT_CLINIC_OR_DEPARTMENT_OTHER): Payer: Medicare PPO | Admitting: Anesthesiology

## 2022-02-26 ENCOUNTER — Ambulatory Visit (HOSPITAL_COMMUNITY): Payer: Medicare PPO | Admitting: Anesthesiology

## 2022-02-26 DIAGNOSIS — E119 Type 2 diabetes mellitus without complications: Secondary | ICD-10-CM | POA: Insufficient documentation

## 2022-02-26 DIAGNOSIS — K31819 Angiodysplasia of stomach and duodenum without bleeding: Secondary | ICD-10-CM

## 2022-02-26 DIAGNOSIS — I10 Essential (primary) hypertension: Secondary | ICD-10-CM | POA: Diagnosis not present

## 2022-02-26 DIAGNOSIS — G4730 Sleep apnea, unspecified: Secondary | ICD-10-CM | POA: Diagnosis not present

## 2022-02-26 DIAGNOSIS — D649 Anemia, unspecified: Secondary | ICD-10-CM

## 2022-02-26 DIAGNOSIS — K649 Unspecified hemorrhoids: Secondary | ICD-10-CM | POA: Diagnosis not present

## 2022-02-26 DIAGNOSIS — K449 Diaphragmatic hernia without obstruction or gangrene: Secondary | ICD-10-CM | POA: Diagnosis not present

## 2022-02-26 DIAGNOSIS — K921 Melena: Secondary | ICD-10-CM

## 2022-02-26 DIAGNOSIS — M1990 Unspecified osteoarthritis, unspecified site: Secondary | ICD-10-CM | POA: Diagnosis not present

## 2022-02-26 DIAGNOSIS — K31811 Angiodysplasia of stomach and duodenum with bleeding: Secondary | ICD-10-CM | POA: Diagnosis not present

## 2022-02-26 DIAGNOSIS — E785 Hyperlipidemia, unspecified: Secondary | ICD-10-CM | POA: Diagnosis not present

## 2022-02-26 DIAGNOSIS — Z7984 Long term (current) use of oral hypoglycemic drugs: Secondary | ICD-10-CM | POA: Diagnosis not present

## 2022-02-26 DIAGNOSIS — K219 Gastro-esophageal reflux disease without esophagitis: Secondary | ICD-10-CM | POA: Insufficient documentation

## 2022-02-26 HISTORY — PX: HOT HEMOSTASIS: SHX5433

## 2022-02-26 HISTORY — PX: ESOPHAGOGASTRODUODENOSCOPY (EGD) WITH PROPOFOL: SHX5813

## 2022-02-26 LAB — CBC
HCT: 33.2 % — ABNORMAL LOW (ref 36.0–46.0)
Hemoglobin: 10.1 g/dL — ABNORMAL LOW (ref 12.0–15.0)
MCH: 27.9 pg (ref 26.0–34.0)
MCHC: 30.4 g/dL (ref 30.0–36.0)
MCV: 91.7 fL (ref 80.0–100.0)
Platelets: 155 10*3/uL (ref 150–400)
RBC: 3.62 MIL/uL — ABNORMAL LOW (ref 3.87–5.11)
RDW: 14.5 % (ref 11.5–15.5)
WBC: 4 10*3/uL (ref 4.0–10.5)
nRBC: 0 % (ref 0.0–0.2)

## 2022-02-26 LAB — GLUCOSE, CAPILLARY: Glucose-Capillary: 76 mg/dL (ref 70–99)

## 2022-02-26 SURGERY — ESOPHAGOGASTRODUODENOSCOPY (EGD) WITH PROPOFOL
Anesthesia: General

## 2022-02-26 MED ORDER — LACTATED RINGERS IV SOLN: INTRAVENOUS | Status: DC

## 2022-02-26 MED ORDER — PROPOFOL 500 MG/50ML IV EMUL
INTRAVENOUS | Status: DC | PRN
  Administered 2022-02-26: 175 ug/kg/min via INTRAVENOUS

## 2022-02-26 MED ORDER — PROPOFOL 10 MG/ML IV BOLUS
INTRAVENOUS | Status: DC | PRN
  Administered 2022-02-26: 40 mg via INTRAVENOUS

## 2022-02-26 NOTE — Op Note (Signed)
[facility] Patient Name: Kristin Barnes Procedure Date: 02/26/2022 12:10 PM MRN: [id number] Date of Birth: [birth year] Attending MD: Maylon Peppers , , [id number] CSN: [id number] Age: 81 Admit Type: Outpatient Procedure:                Upper GI endoscopy Indications:              Melena, Anemia Providers:                Maylon Peppers, Caprice Kluver, Aram Candela Referring MD:              Medicines:                Monitored Anesthesia Care Complications:            No immediate complications. Estimated Blood Loss:     Estimated blood loss: none. Procedure:                Pre-Anesthesia Assessment:                           - Prior to the procedure, a History and Physical                            was performed, and patient medications, allergies                            and sensitivities were reviewed. The patient's                            tolerance of previous anesthesia was reviewed.                           - The risks and benefits of the procedure and the                            sedation options and risks were discussed with the                            patient. All questions were answered and informed                            consent was obtained.                           - After reviewing the risks and benefits, the                            patient was deemed in satisfactory condition to                            undergo the procedure.                           - ASA Grade Assessment: III - A patient with severe                            systemic disease.  After obtaining informed consent, the endoscope was                            passed under direct vision. Throughout the                            procedure, the patient's blood pressure, pulse, and                            oxygen saturations were monitored continuously. The                            [id number]-H190 ([id number]) scope was introduced through the                             mouth, and advanced to the second part of duodenum.                            The upper GI endoscopy was accomplished without                            difficulty. The patient tolerated the procedure                            well. Scope In: 12:29:32 PM Scope Out: 12:38:25 PM Total Procedure Duration: 0 hours 8 minutes 53 seconds  Findings:      A 1 cm hiatal hernia was present.      The stomach was normal.      A single 5 mm angiodysplastic lesion without bleeding was found in the       second portion of the duodenum. Coagulation for bleeding prevention       using argon plasma at 0.3 liters/minute and 20 watts was successful. Impression:               - 1 cm hiatal hernia.                           - Normal stomach.                           - A single non-bleeding angiodysplastic lesion in                            the duodenum. Treated with argon plasma coagulation                            (APC).                           - No specimens collected. Moderate Sedation:      Per Anesthesia Care Recommendation:           - Discharge patient to home (ambulatory).                           - Resume previous diet.                           -  Await pathology results.                           - Check H/H                           - F/u in January as scheduled. Procedure Code(s):        --- Professional ---                           [id number], Esophagogastroduodenoscopy, flexible,                            transoral; with control of bleeding, any method Diagnosis Code(s):        --- Professional ---                           Y81.448, Angiodysplasia of stomach and duodenum                            without bleeding                           K92.1, Melena (includes Hematochezia)                           D64.9, Anemia, unspecified CPT copyright 2022 American Medical Association. All rights reserved. The codes documented in this report are preliminary and upon coder review may   be revised to meet current compliance requirements. Maylon Peppers, MD Maylon Peppers,  02/26/2022 12:44:43 PM This report has been signed electronically. Number of Addenda: 0

## 2022-02-26 NOTE — Discharge Instructions (Signed)
You are being discharged to home.  Resume your previous diet.  We are waiting for your pathology results.  Follow up in January as scheduled.

## 2022-02-26 NOTE — Anesthesia Preprocedure Evaluation (Signed)
Anesthesia Evaluation  Patient identified by MRN, date of birth, ID band Patient awake    Reviewed: Allergy & Precautions, NPO status , Patient's Chart, lab work & pertinent test results  Airway Mallampati: II  TM Distance: >3 FB Neck ROM: Full    Dental  (+) Dental Advisory Given, Edentulous Upper, Missing   Pulmonary sleep apnea    Pulmonary exam normal breath sounds clear to auscultation       Cardiovascular Exercise Tolerance: Good hypertension, Pt. on home beta blockers Normal cardiovascular exam Rhythm:Regular Rate:Normal     Neuro/Psych negative neurological ROS  negative psych ROS   GI/Hepatic Neg liver ROS,GERD  Medicated and Controlled,,  Endo/Other  diabetes, Well Controlled, Type 2, Oral Hypoglycemic Agents    Renal/GU negative Renal ROS  negative genitourinary   Musculoskeletal  (+) Arthritis , Osteoarthritis,    Abdominal   Peds negative pediatric ROS (+)  Hematology negative hematology ROS (+)   Anesthesia Other Findings   Reproductive/Obstetrics negative OB ROS                             Anesthesia Physical Anesthesia Plan  ASA: 2  Anesthesia Plan: General   Post-op Pain Management: Minimal or no pain anticipated   Induction: Intravenous  PONV Risk Score and Plan: TIVA  Airway Management Planned: Nasal Cannula and Natural Airway  Additional Equipment:   Intra-op Plan:   Post-operative Plan:   Informed Consent: I have reviewed the patients History and Physical, chart, labs and discussed the procedure including the risks, benefits and alternatives for the proposed anesthesia with the patient or authorized representative who has indicated his/her understanding and acceptance.     Dental advisory given  Plan Discussed with: CRNA and Surgeon  Anesthesia Plan Comments:         Anesthesia Quick Evaluation

## 2022-02-26 NOTE — Anesthesia Postprocedure Evaluation (Signed)
Anesthesia Post Note  Patient: Kristin Barnes  Procedure(s) Performed: ESOPHAGOGASTRODUODENOSCOPY (EGD) WITH PROPOFOL HOT HEMOSTASIS (ARGON PLASMA COAGULATION/BICAP)  Patient location during evaluation: Endoscopy Anesthesia Type: General Level of consciousness: awake and alert Pain management: pain level controlled Vital Signs Assessment: post-procedure vital signs reviewed and stable Respiratory status: spontaneous breathing, nonlabored ventilation, respiratory function stable and patient connected to nasal cannula oxygen Cardiovascular status: blood pressure returned to baseline and stable Postop Assessment: no apparent nausea or vomiting Anesthetic complications: no   There were no known notable events for this encounter.   Last Vitals:  Vitals:   02/26/22 1052 02/26/22 1245  BP: (!) 160/74 129/65  Pulse: (!) 55 79  Resp: 16 18  Temp: 37.2 C (!) 36.4 C  SpO2: 100% 99%    Last Pain:  Vitals:   02/26/22 1245  TempSrc: Axillary  PainSc: 0-No pain                 Trixie Rude

## 2022-02-26 NOTE — Transfer of Care (Signed)
Immediate Anesthesia Transfer of Care Note  Patient: Kristin Barnes  Procedure(s) Performed: ESOPHAGOGASTRODUODENOSCOPY (EGD) WITH PROPOFOL HOT HEMOSTASIS (ARGON PLASMA COAGULATION/BICAP)  Patient Location: PACU  Anesthesia Type:MAC  Level of Consciousness: drowsy  Airway & Oxygen Therapy: Patient Spontanous Breathing and Patient connected to nasal cannula oxygen  Post-op Assessment: Report given to RN and Post -op Vital signs reviewed and stable  Post vital signs: Reviewed and stable  Last Vitals:  Vitals Value Taken Time  BP    Temp    Pulse    Resp    SpO2      Last Pain:  Vitals:   02/26/22 1224  TempSrc:   PainSc: 0-No pain         Complications: No notable events documented.

## 2022-02-26 NOTE — H&P (Signed)
Kristin Barnes is an 81 y.o. female.   Chief Complaint: Melena HPI: Kristin Barnes is a 81 y.o. female with past medical history of arthritis, DM, GERD, HLD, HTN, sleep apnea, coming for evaluation of anemia and melena.  Patient was presenting recurrent melena in the past but this has subsided.  Denies any complaints.  Has not had any recent hemoglobin, with most recent lab result of 11.0 on 12/22/2021.  Past Medical History:  Diagnosis Date   Arthritis    hands and finger   Diabetes mellitus without complication (HCC)    GERD (gastroesophageal reflux disease)    uses club soda   Hyperlipidemia    Hypertension    Sleep apnea    states she had a CPAP machine at one time but was picked up...over 77yr ago    Past Surgical History:  Procedure Laterality Date   CHOLECYSTECTOMY     COLONOSCOPY WITH PROPOFOL N/A 05/18/2021   Procedure: COLONOSCOPY WITH PROPOFOL;  Surgeon: CHarvel Quale MD;  [facility];  Service: Gastroenterology;  Laterality: N/A;  200   POLYPECTOMY  05/18/2021   Procedure: POLYPECTOMY;  Surgeon: CHarvel Quale MD;  [facility];  Service: Gastroenterology;;   THYROIDECTOMY Right 08/19/2016   Procedure: RIGHT HEMI-THYROIDECTOMY;  Surgeon: TLeta Baptist MD;  Location[facility] [employer]  Service: ENT;  Laterality: Right;   TUBAL LIGATION      Family History  Problem Relation Age of Onset   Diabetes Mother    CVA Mother    CAD Father    Breast cancer Sister    Social History:  reports that she has never smoked. She has never used smokeless tobacco. She reports that she does not drink alcohol and does not use drugs.  Allergies: No Known Allergies  Medications Prior to Admission  Medication Sig Dispense Refill   atorvastatin (LIPITOR) 10 MG tablet Take 10 mg by mouth daily.     Cyanocobalamin (B-12 PO) Take 1 tablet by mouth daily.     furosemide (LASIX) 20 MG tablet Take 20 mg by mouth daily.     gabapentin (NEURONTIN)  300 MG capsule Take 300 mg by mouth daily.     losartan (COZAAR) 100 MG tablet Take 100 mg by mouth daily.     oxybutynin (DITROPAN XL) 15 MG 24 hr tablet Take 15 mg by mouth at bedtime.     pantoprazole (PROTONIX) 40 MG tablet Take 1 tablet (40 mg total) by mouth daily. 30 tablet 1   primidone (MYSOLINE) 50 MG tablet Take 50 mg by mouth daily.      Results for orders placed or performed during the hospital encounter of 02/26/22 (from the past 48 hour(s))  Glucose, capillary     Status: None   Collection Time: 02/26/22 11:11 AM  Result Value Ref Range   Glucose-Capillary 76 70 - 99 mg/dL    Comment: Glucose reference range applies only to samples taken after fasting for at least 8 hours.   No results found.  Review of Systems  Constitutional: Negative.   HENT: Negative.    Eyes: Negative.   Respiratory: Negative.    Cardiovascular: Negative.   Gastrointestinal:  Positive for blood in stool.  Endocrine: Negative.   Genitourinary: Negative.   Musculoskeletal: Negative.   Skin: Negative.   Allergic/Immunologic: Negative.   Neurological: Negative.   Hematological: Negative.   Psychiatric/Behavioral: Negative.      Blood pressure (!) 160/74, pulse (!) 55, temperature 98.9 F (37.2 C),  temperature source Oral, resp. rate 16, SpO2 100 %. Physical Exam  GENERAL: The patient is AO x3, in no acute distress. HEENT: Head is normocephalic and atraumatic. EOMI are intact. Mouth is well hydrated and without lesions. NECK: Supple. No masses LUNGS: Clear to auscultation. No presence of rhonchi/wheezing/rales. Adequate chest expansion HEART: RRR, normal s1 and s2. ABDOMEN: Soft, nontender, no guarding, no peritoneal signs, and nondistended. BS +. No masses. EXTREMITIES: Without any cyanosis, clubbing, rash, lesions or edema. NEUROLOGIC: AOx3, no focal motor deficit. SKIN: no jaundice, no rashes  Assessment/Plan  Kristin Barnes is a 81 y.o. female with past medical history of arthritis, DM,  GERD, HLD, HTN, sleep apnea, coming for evaluation of anemia and melena.  Will proceed with EGD.  Harvel Quale, MD 02/26/2022, 11:29 AM

## 2022-02-26 NOTE — Anesthesia Procedure Notes (Signed)
Procedure Name: MAC Date/Time: 02/26/2022 12:32 PM  Performed by: Lieutenant Diego, CRNAPre-anesthesia Checklist: Patient identified, Emergency Drugs available, Suction available, Patient being monitored and Timeout performed Patient Re-evaluated:Patient Re-evaluated prior to induction Oxygen Delivery Method: Nasal cannula Preoxygenation: Pre-oxygenation with 100% oxygen Induction Type: IV induction

## 2022-03-05 ENCOUNTER — Encounter (HOSPITAL_COMMUNITY): Payer: Self-pay | Admitting: Gastroenterology

## 2022-03-13 IMAGING — US US EXTREM LOW VENOUS*R*
1 series · 13 of 24 positions shown · non-contrast
Comparison: None.

CLINICAL DATA: Right lower extremity pain and edema.

EXAM:
RIGHT LOWER EXTREMITY VENOUS DOPPLER ULTRASOUND
TECHNIQUE: Gray-scale sonography with compression, as well as color and duplex
ultrasound, were performed to evaluate the deep venous system(s)
from the level of the common femoral vein through the popliteal and
proximal calf veins.

[Series 1: us venous img lower uni right (dvt) · portal-venous · 13 of 40 slices shown]
[im 1/40]
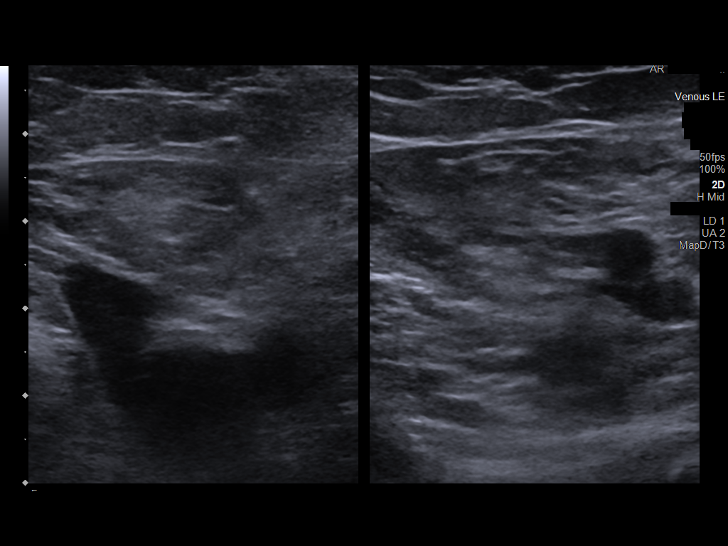
[im 4/40]
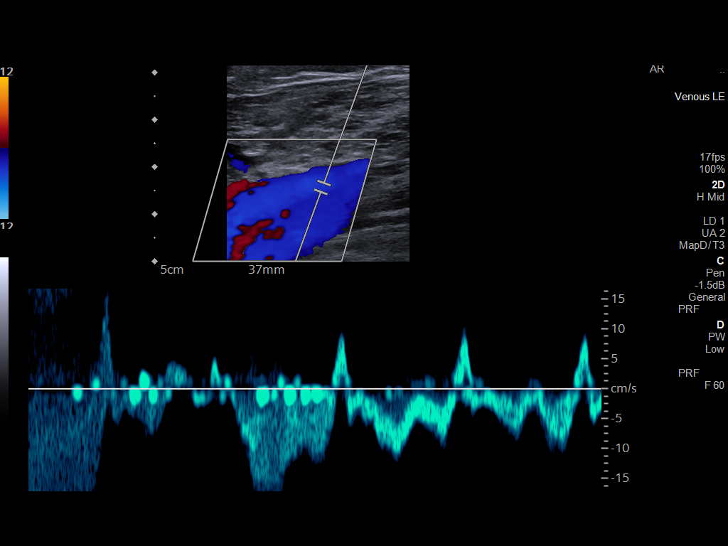
[im 7/40]
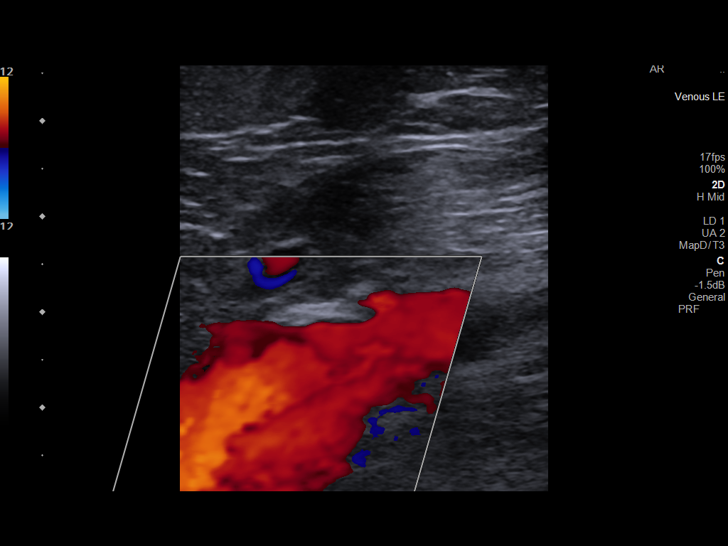
[im 11/40]
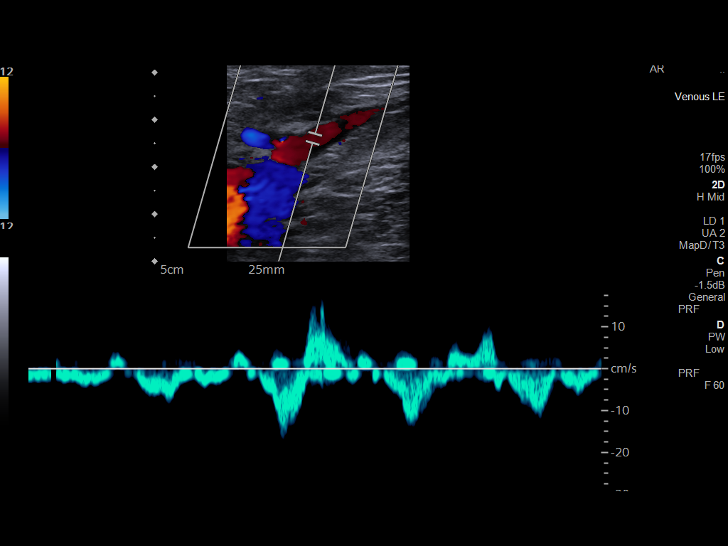
[im 14/40]
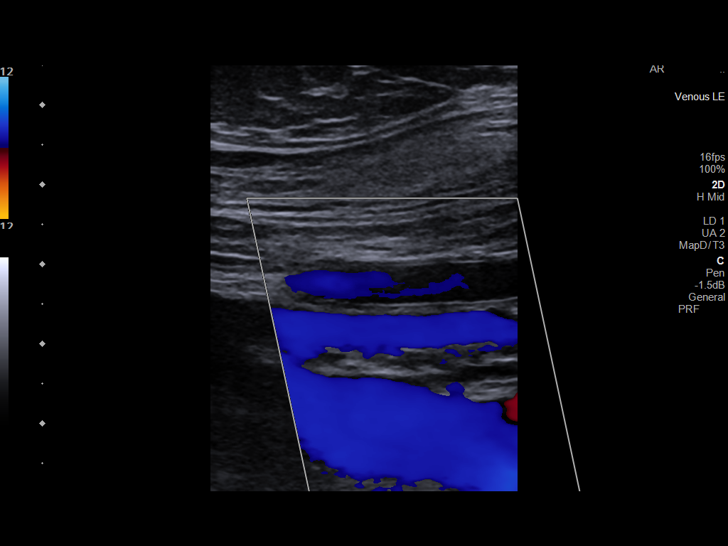
[im 17/40]
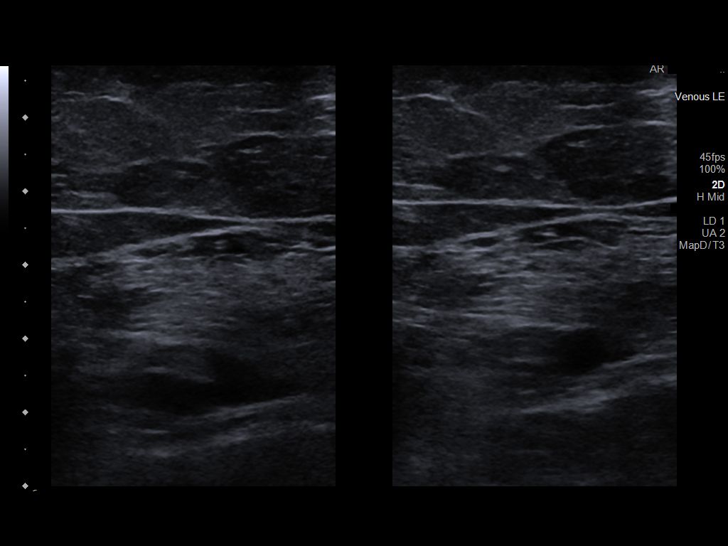
[im 21/40]
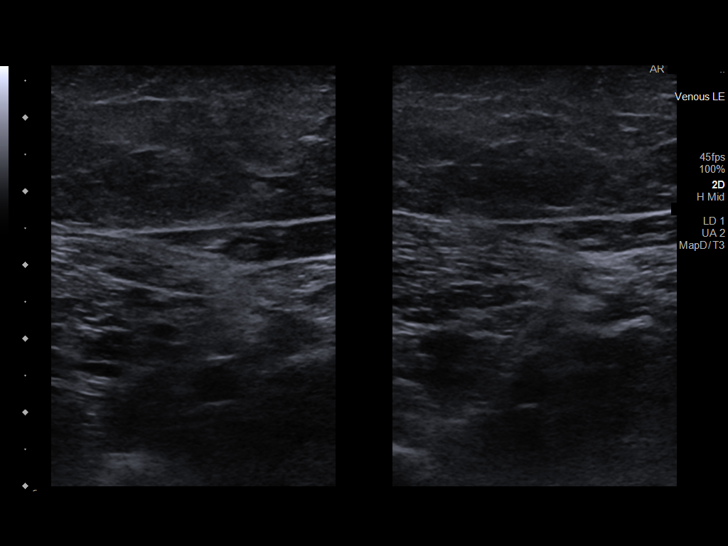
[im 23/40]
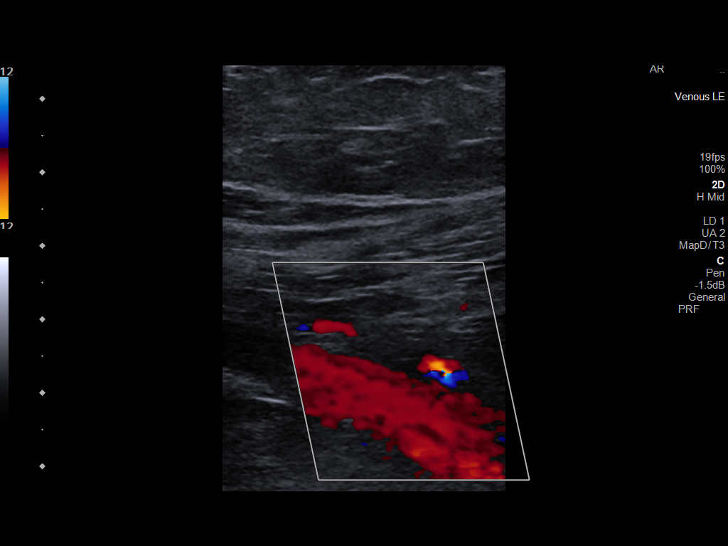
[im 26/40]
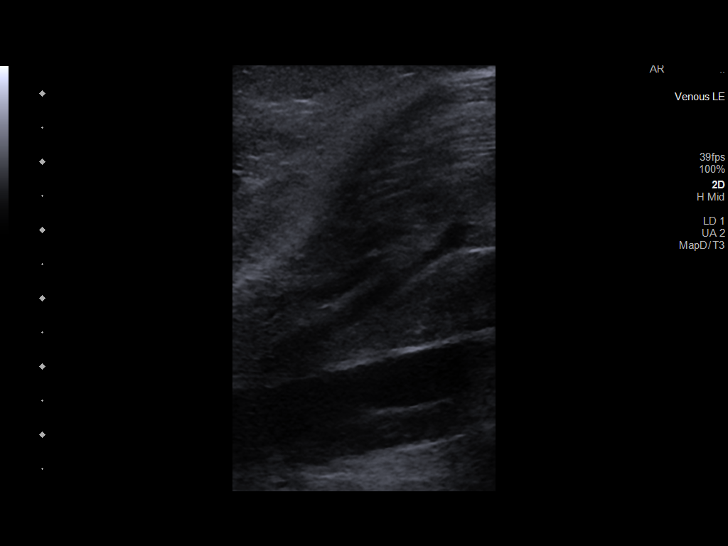
[im 29/40]
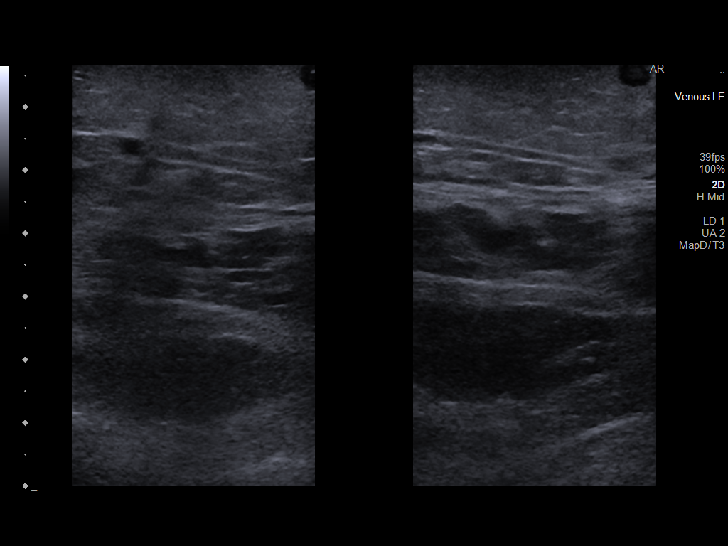
[im 33/40]
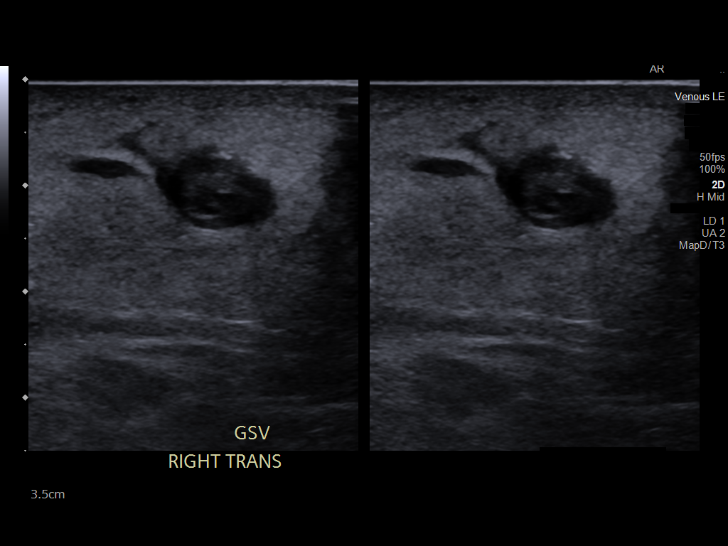
[im 36/40]
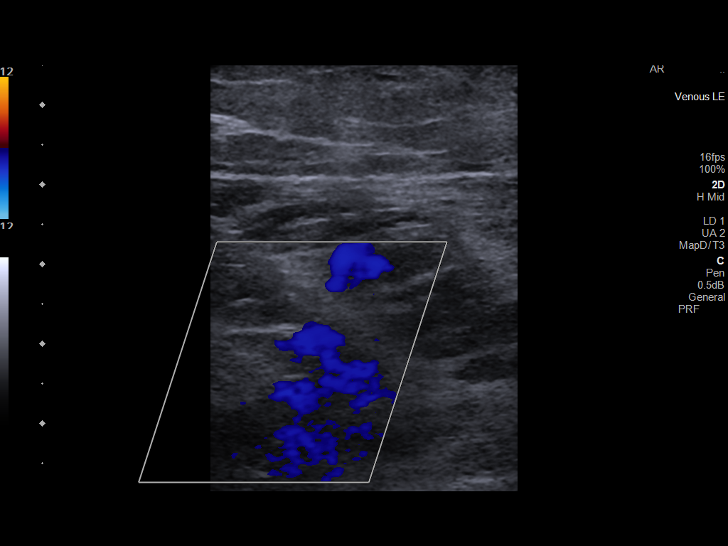
[im 40/40]
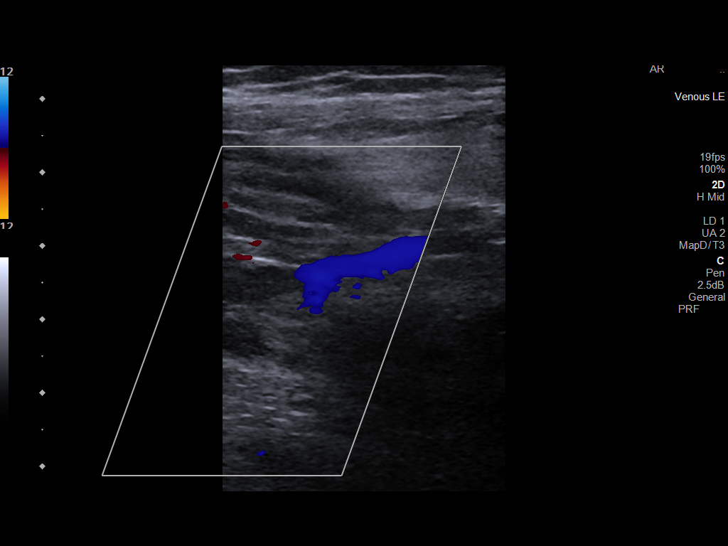

[13 of 24 positions shown; findings below may reference images not displayed]

FINDINGS: VENOUS

Normal compressibility of the common femoral, superficial femoral,
and popliteal veins, as well as the visualized calf veins.
Visualized portions of profunda femoral vein and great saphenous
vein unremarkable. No filling defects to suggest DVT on grayscale or
color Doppler imaging. Doppler waveforms show normal direction of
venous flow, normal respiratory plasticity and response to
augmentation.

There is occlusive thrombus noted within the great saphenous vein
and lesser saphenous vein.

Limited views of the contralateral common femoral vein are
unremarkable.

OTHER

None.

Limitations: none
IMPRESSION: 1. The study is negative for deep vein thrombosis.
2. The study is positive for superficial thrombophlebitis involving
the great saphenous vein and the lesser saphenous vein on the right.

## 2022-03-15 DIAGNOSIS — I10 Essential (primary) hypertension: Secondary | ICD-10-CM | POA: Diagnosis not present

## 2022-03-25 ENCOUNTER — Encounter (INDEPENDENT_AMBULATORY_CARE_PROVIDER_SITE_OTHER): Payer: Self-pay | Admitting: Gastroenterology

## 2022-03-25 ENCOUNTER — Ambulatory Visit (INDEPENDENT_AMBULATORY_CARE_PROVIDER_SITE_OTHER): Payer: Medicare PPO | Admitting: Gastroenterology

## 2022-03-25 ENCOUNTER — Other Ambulatory Visit (INDEPENDENT_AMBULATORY_CARE_PROVIDER_SITE_OTHER): Payer: Self-pay | Admitting: *Deleted

## 2022-03-25 VITALS — BP 167/83 | HR 61 | Temp 98.2°F | Ht 62.0 in | Wt 215.5 lb

## 2022-03-25 DIAGNOSIS — D508 Other iron deficiency anemias: Secondary | ICD-10-CM

## 2022-03-25 DIAGNOSIS — D649 Anemia, unspecified: Secondary | ICD-10-CM | POA: Insufficient documentation

## 2022-03-25 DIAGNOSIS — K5520 Angiodysplasia of colon without hemorrhage: Secondary | ICD-10-CM

## 2022-03-25 MED ORDER — PANTOPRAZOLE SODIUM 40 MG PO TBEC: 40.0000 mg | DELAYED_RELEASE_TABLET | Freq: Every day | ORAL | 3 refills | Status: DC

## 2022-03-25 NOTE — Progress Notes (Addendum)
Referring Provider: Glenda Chroman, MD Primary Care Physician:  Glenda Chroman, MD Primary GI Physician: Jenetta Downer   Chief Complaint  Patient presents with   Gastroesophageal Reflux    3 month follow up on GERD and constipation. Reports reflux is doing ok. Having one stool a day. Has not seen any rectal bleeding.     HPI:   Kristin Barnes is a 82 y.o. female with past medical history of  arthritis, DM, GERD, HLD, HTN, sleep apnea   Patient presenting today for follow up of melena/s/p EGD  Last seen October 2023, at that time having black stools previously that lasted about 2 weeks.  Last episode of melena was on Monday prior to her visit, she noticed a streak of BRB at that time as well but no other BRB. She went to [facility] on 12/22/21 for evaluation of melena. She notes that she has had lower abdominal pain as well that began with the melena. Is trying to do more bland foods and soups. Denies diarrhea. Has some constipation with hard stool balls at times. She is drinking prune juice which seems to help with constipation. Notably having about 2 BMs per day. Lower abdominal pain improves with a BM. Has some epigastric and RUQ pain. Denies any recent NSAID use. Has occasional GERD symptoms. Has been taking protonix daily since ED visit. No SOB, dizziness, lightheadedness or syncope.   Also with  mildly elevated AP, last being 139, recommended AMA, IgM, AP isoenzymes.  Recommended to take PPI daily, schedule EGD, avoid NSAIDs, further work up of elevated AP.  IgM, AMA, and isoenzymes all normal, most recent AP in October was 116. EGD as outlined below  Last hgb 10.1 on 12/12.   Present:  Patient reports she is feeling okay today. Denies any abdominal pain. Denies any rectal bleeding or melena. Having a BM once daily. Appetite is good. Weight is stable. No nausea or vomiting. Is avoiding NSAIDs.   She notes that she checks BP at home, it goes up and down. Has a mild headache today. Denies blurred  vision or dizziness. States that her BP readings go to her PCP automatically.   Last Colonoscopy:05/18/21 One 3 mm polyp in the proximal ascending colon(tubular adenoma) - Diverticulosis in the sigmoid colon - Non-bleeding internal hemorrhoids. Last Endoscopy:02/26/22- 1 cm hiatal hernia. - Normal stomach. - A single non-bleeding angiodysplastic lesion in the duodenum. Treated with argon plasma coagulation (APC). - No specimens collected.  Recommendations:    Past Medical History:  Diagnosis Date   Arthritis    hands and finger   Diabetes mellitus without complication (HCC)    GERD (gastroesophageal reflux disease)    uses club soda   Hyperlipidemia    Hypertension    Sleep apnea    states she had a CPAP machine at one time but was picked up...over 58yr ago    Past Surgical History:  Procedure Laterality Date   CHOLECYSTECTOMY     COLONOSCOPY WITH PROPOFOL N/A 05/18/2021   Procedure: COLONOSCOPY WITH PROPOFOL;  Surgeon: CHarvel Quale MD;  [facility];  Service: Gastroenterology;  Laterality: N/A;  200   ESOPHAGOGASTRODUODENOSCOPY (EGD) WITH PROPOFOL N/A 02/26/2022   Procedure: ESOPHAGOGASTRODUODENOSCOPY (EGD) WITH PROPOFOL;  Surgeon: CHarvel Quale MD;  [facility];  Service: Gastroenterology;  Laterality: N/A;  1145 ASA 3   HOT HEMOSTASIS  02/26/2022   Procedure: HOT HEMOSTASIS (ARGON PLASMA COAGULATION/BICAP);  Surgeon: CMontez Morita DQuillian Quince MD;  [facility];  Service: Gastroenterology;;   POLYPECTOMY  05/18/2021   Procedure: POLYPECTOMY;  Surgeon: Montez Morita, Quillian Quince, MD;  [facility];  Service: Gastroenterology;;   THYROIDECTOMY Right 08/19/2016   Procedure: RIGHT HEMI-THYROIDECTOMY;  Surgeon: Leta Baptist, MD;  Location: [facility];  Service: ENT;  Laterality: Right;   TUBAL LIGATION      Current Outpatient Medications  Medication Sig Dispense Refill   atorvastatin (LIPITOR) 10 MG  tablet Take 10 mg by mouth daily.     Cyanocobalamin (B-12 PO) Take 1 tablet by mouth daily.     furosemide (LASIX) 20 MG tablet Take 20 mg by mouth daily.     gabapentin (NEURONTIN) 300 MG capsule Take 300 mg by mouth daily.     losartan (COZAAR) 100 MG tablet Take 100 mg by mouth daily.     oxybutynin (DITROPAN XL) 15 MG 24 hr tablet Take 15 mg by mouth at bedtime.     pantoprazole (PROTONIX) 40 MG tablet Take 1 tablet (40 mg total) by mouth daily. 30 tablet 1   primidone (MYSOLINE) 50 MG tablet Take 50 mg by mouth daily.     No current facility-administered medications for this visit.    Allergies as of 03/25/2022   (No Known Allergies)    Family History  Problem Relation Age of Onset   Diabetes Mother    CVA Mother    CAD Father    Breast cancer Sister     Social History   Socioeconomic History   Marital status: Married    Spouse name: Not on file   Number of children: Not on file   Years of education: Not on file   Highest education level: Not on file  Occupational History   Not on file  Tobacco Use   Smoking status: Never   Smokeless tobacco: Never  Vaping Use   Vaping Use: Never used  Substance and Sexual Activity   Alcohol use: No   Drug use: No   Sexual activity: Yes  Other Topics Concern   Not on file  Social History Narrative   Not on file   Social Determinants of Health   Financial Resource Strain: Not on file  Food Insecurity: Not on file  Transportation Needs: Not on file  Physical Activity: Not on file  Stress: Not on file  Social Connections: Not on file   Review of systems General: negative for malaise, night sweats, fever, chills, weight loss Neck: Negative for lumps, goiter, pain and significant neck swelling Resp: Negative for cough, wheezing, dyspnea at rest CV: Negative for chest pain, leg swelling, palpitations, orthopnea GI: denies melena, hematochezia, nausea, vomiting, diarrhea, constipation, dysphagia, odyonophagia, early  satiety or unintentional weight loss.  MSK: Negative for joint pain or swelling, back pain, and muscle pain. Derm: Negative for itching or rash Psych: Denies depression, anxiety, memory loss, confusion. No homicidal or suicidal ideation.  Heme: Negative for prolonged bleeding, bruising easily, and swollen nodes. Endocrine: Negative for cold or heat intolerance, polyuria, polydipsia and goiter. Neuro: negative for tremor, gait imbalance, syncope and seizures. The remainder of the review of systems is noncontributory.  Physical Exam: There were no vitals taken for this visit. General:   Alert and oriented. No distress noted. Pleasant and cooperative.  Head:  Normocephalic and atraumatic. Eyes:  Conjuctiva clear without scleral icterus. Mouth:  Oral mucosa pink and moist. Good dentition. No lesions. Heart: Normal rate and rhythm, s1 and s2 heart sounds present.  Lungs: Clear lung  sounds in all lobes. Respirations equal and unlabored. Abdomen:  +BS, soft, non-tender and non-distended. No rebound or guarding. No HSM or masses noted. Derm: No palmar erythema or jaundice Msk:  Symmetrical without gross deformities. Normal posture. Extremities:  Without edema. Neurologic:  Alert and  oriented x4 Psych:  Alert and cooperative. Normal mood and affect.  Invalid input(s): "6 MONTHS"   ASSESSMENT: Kristin Barnes is a 82 y.o. female presenting today for follow up of melena.   Melena, thought secondary to AVM on recent EGD that was treated with APC. Previously on protonix as gastroprotective agent though she ran out of this. Will restart PPI daily. Advised to avoid NSAIDs. She has no melena, rectal bleeding, abdominal pain, weight loss or changes in appetite. Will repeat h&h to evaluate if anemia has resolved as last hgb was 10.1 on 12/12.   The patient was found to have elevated blood pressure when vital signs were checked in the office. The blood pressure was rechecked by the nursing staff and it was  found be persistently elevated >140/90 mmHg. I personally advised to the patient to follow up closely with PCP for hypertension control.   PLAN:  Avoid NSAIDs  2. Continue PPI daily. Refill resent 3. Follow up with PCP about HTN  4. Repeat h&h at the end of the week  5. Pt to make me aware of rectal bleeding or melena.   All questions were answered, patient verbalized understanding and is in agreement with plan as outlined above.   Follow Up: 3 months   Yazmeen Woolf L. Alver Sorrow, MSN, APRN, AGNP-C Adult-Gerontology Nurse Practitioner [facility]  I have reviewed the note and agree with the APP's assessment as described in this progress note  Maylon Peppers, MD Gastroenterology and Hepatology [facility]

## 2022-03-25 NOTE — Patient Instructions (Signed)
Please restart protonix '40mg'$  daily, I have sent this to your pharmacy We will check blood counts again, you can do this Thursday or Friday or this week Please avoid NSAIDs (advil, aleve, naproxen, goody powder, ibuprofen) as these can be very hard on your GI tract, causing inflammation, ulcers and damage to the lining of your GI tract.   Please make me aware if you have any further black stools, rectal bleeding  Follow up 3 months

## 2022-03-26 ENCOUNTER — Other Ambulatory Visit (INDEPENDENT_AMBULATORY_CARE_PROVIDER_SITE_OTHER): Payer: Self-pay | Admitting: Gastroenterology

## 2022-03-26 DIAGNOSIS — D649 Anemia, unspecified: Secondary | ICD-10-CM

## 2022-03-26 LAB — HEMOGLOBIN AND HEMATOCRIT, BLOOD
HCT: 33.5 % — ABNORMAL LOW (ref 35.0–45.0)
Hemoglobin: 10.6 g/dL — ABNORMAL LOW (ref 11.7–15.5)

## 2022-03-28 DIAGNOSIS — D649 Anemia, unspecified: Secondary | ICD-10-CM | POA: Diagnosis not present

## 2022-03-29 LAB — IRON,TIBC AND FERRITIN PANEL
%SAT: 10 % (calc) — ABNORMAL LOW (ref 16–45)
Ferritin: 19 ng/mL (ref 16–288)
Iron: 40 ug/dL — ABNORMAL LOW (ref 45–160)
TIBC: 417 mcg/dL (calc) (ref 250–450)

## 2022-03-31 ENCOUNTER — Other Ambulatory Visit (INDEPENDENT_AMBULATORY_CARE_PROVIDER_SITE_OTHER): Payer: Self-pay | Admitting: Gastroenterology

## 2022-03-31 MED ORDER — FERROUS SULFATE 325 (65 FE) MG PO TABS: 325.0000 mg | ORAL_TABLET | Freq: Every day | ORAL | 1 refills | Status: DC

## 2022-04-01 ENCOUNTER — Encounter (INDEPENDENT_AMBULATORY_CARE_PROVIDER_SITE_OTHER): Payer: Self-pay

## 2022-04-08 ENCOUNTER — Other Ambulatory Visit (INDEPENDENT_AMBULATORY_CARE_PROVIDER_SITE_OTHER): Payer: Self-pay

## 2022-04-08 DIAGNOSIS — K5520 Angiodysplasia of colon without hemorrhage: Secondary | ICD-10-CM | POA: Diagnosis not present

## 2022-04-08 DIAGNOSIS — D649 Anemia, unspecified: Secondary | ICD-10-CM | POA: Diagnosis not present

## 2022-04-08 DIAGNOSIS — K921 Melena: Secondary | ICD-10-CM | POA: Diagnosis not present

## 2022-04-08 DIAGNOSIS — K625 Hemorrhage of anus and rectum: Secondary | ICD-10-CM

## 2022-04-09 LAB — IRON,TIBC AND FERRITIN PANEL
%SAT: 14 % (calc) — ABNORMAL LOW (ref 16–45)
Ferritin: 23 ng/mL (ref 16–288)
Iron: 64 ug/dL (ref 45–160)
TIBC: 447 mcg/dL (calc) (ref 250–450)

## 2022-04-15 DIAGNOSIS — I10 Essential (primary) hypertension: Secondary | ICD-10-CM | POA: Diagnosis not present

## 2022-04-19 ENCOUNTER — Encounter (INDEPENDENT_AMBULATORY_CARE_PROVIDER_SITE_OTHER): Payer: Self-pay

## 2022-04-19 ENCOUNTER — Other Ambulatory Visit (INDEPENDENT_AMBULATORY_CARE_PROVIDER_SITE_OTHER): Payer: Self-pay

## 2022-04-19 DIAGNOSIS — K921 Melena: Secondary | ICD-10-CM

## 2022-04-19 DIAGNOSIS — Z01812 Encounter for preprocedural laboratory examination: Secondary | ICD-10-CM

## 2022-04-19 DIAGNOSIS — R748 Abnormal levels of other serum enzymes: Secondary | ICD-10-CM

## 2022-04-19 DIAGNOSIS — K625 Hemorrhage of anus and rectum: Secondary | ICD-10-CM

## 2022-04-19 DIAGNOSIS — D649 Anemia, unspecified: Secondary | ICD-10-CM

## 2022-04-19 DIAGNOSIS — I10 Essential (primary) hypertension: Secondary | ICD-10-CM

## 2022-04-19 DIAGNOSIS — D508 Other iron deficiency anemias: Secondary | ICD-10-CM

## 2022-04-19 DIAGNOSIS — Z8719 Personal history of other diseases of the digestive system: Secondary | ICD-10-CM

## 2022-04-19 DIAGNOSIS — K5520 Angiodysplasia of colon without hemorrhage: Secondary | ICD-10-CM

## 2022-05-02 DIAGNOSIS — Z1339 Encounter for screening examination for other mental health and behavioral disorders: Secondary | ICD-10-CM | POA: Diagnosis not present

## 2022-05-02 DIAGNOSIS — Z0000 Encounter for general adult medical examination without abnormal findings: Secondary | ICD-10-CM | POA: Diagnosis not present

## 2022-05-02 DIAGNOSIS — I10 Essential (primary) hypertension: Secondary | ICD-10-CM | POA: Diagnosis not present

## 2022-05-02 DIAGNOSIS — Z1211 Encounter for screening for malignant neoplasm of colon: Secondary | ICD-10-CM | POA: Diagnosis not present

## 2022-05-02 DIAGNOSIS — D692 Other nonthrombocytopenic purpura: Secondary | ICD-10-CM | POA: Diagnosis not present

## 2022-05-02 DIAGNOSIS — I739 Peripheral vascular disease, unspecified: Secondary | ICD-10-CM | POA: Diagnosis not present

## 2022-05-02 DIAGNOSIS — Z299 Encounter for prophylactic measures, unspecified: Secondary | ICD-10-CM | POA: Diagnosis not present

## 2022-05-02 DIAGNOSIS — Z1331 Encounter for screening for depression: Secondary | ICD-10-CM | POA: Diagnosis not present

## 2022-05-02 DIAGNOSIS — Z7189 Other specified counseling: Secondary | ICD-10-CM | POA: Diagnosis not present

## 2022-05-15 DIAGNOSIS — I10 Essential (primary) hypertension: Secondary | ICD-10-CM | POA: Diagnosis not present

## 2022-05-17 DIAGNOSIS — I10 Essential (primary) hypertension: Secondary | ICD-10-CM | POA: Diagnosis not present

## 2022-05-17 DIAGNOSIS — J439 Emphysema, unspecified: Secondary | ICD-10-CM | POA: Diagnosis not present

## 2022-05-17 DIAGNOSIS — Z299 Encounter for prophylactic measures, unspecified: Secondary | ICD-10-CM | POA: Diagnosis not present

## 2022-05-17 DIAGNOSIS — R52 Pain, unspecified: Secondary | ICD-10-CM | POA: Diagnosis not present

## 2022-05-28 ENCOUNTER — Other Ambulatory Visit (INDEPENDENT_AMBULATORY_CARE_PROVIDER_SITE_OTHER): Payer: Self-pay | Admitting: *Deleted

## 2022-05-28 ENCOUNTER — Other Ambulatory Visit (INDEPENDENT_AMBULATORY_CARE_PROVIDER_SITE_OTHER): Payer: Self-pay | Admitting: Gastroenterology

## 2022-05-28 MED ORDER — PANTOPRAZOLE SODIUM 40 MG PO TBEC: 40.0000 mg | DELAYED_RELEASE_TABLET | Freq: Every day | ORAL | 2 refills | Status: DC

## 2022-06-07 ENCOUNTER — Telehealth (INDEPENDENT_AMBULATORY_CARE_PROVIDER_SITE_OTHER): Payer: Self-pay | Admitting: *Deleted

## 2022-06-07 NOTE — Telephone Encounter (Signed)
Patient in reminder file for Fe+TIBC+Fer due 06/14/22 per chelsea. Patient called and she would like orders mailed to her for her to do end of march. I printed orders and placed int he mail.

## 2022-06-10 ENCOUNTER — Ambulatory Visit (INDEPENDENT_AMBULATORY_CARE_PROVIDER_SITE_OTHER): Payer: Medicare PPO | Admitting: Gastroenterology

## 2022-06-15 DIAGNOSIS — I10 Essential (primary) hypertension: Secondary | ICD-10-CM | POA: Diagnosis not present

## 2022-06-25 DIAGNOSIS — Z79899 Other long term (current) drug therapy: Secondary | ICD-10-CM | POA: Diagnosis not present

## 2022-06-25 DIAGNOSIS — Z299 Encounter for prophylactic measures, unspecified: Secondary | ICD-10-CM | POA: Diagnosis not present

## 2022-06-25 DIAGNOSIS — I10 Essential (primary) hypertension: Secondary | ICD-10-CM | POA: Diagnosis not present

## 2022-06-25 DIAGNOSIS — E7800 Pure hypercholesterolemia, unspecified: Secondary | ICD-10-CM | POA: Diagnosis not present

## 2022-06-25 DIAGNOSIS — R5383 Other fatigue: Secondary | ICD-10-CM | POA: Diagnosis not present

## 2022-06-25 DIAGNOSIS — Z6837 Body mass index (BMI) 37.0-37.9, adult: Secondary | ICD-10-CM | POA: Diagnosis not present

## 2022-06-25 DIAGNOSIS — Z0000 Encounter for general adult medical examination without abnormal findings: Secondary | ICD-10-CM | POA: Diagnosis not present

## 2022-06-25 DIAGNOSIS — E039 Hypothyroidism, unspecified: Secondary | ICD-10-CM | POA: Diagnosis not present

## 2022-07-05 DIAGNOSIS — Z299 Encounter for prophylactic measures, unspecified: Secondary | ICD-10-CM | POA: Diagnosis not present

## 2022-07-05 DIAGNOSIS — I10 Essential (primary) hypertension: Secondary | ICD-10-CM | POA: Diagnosis not present

## 2022-07-05 DIAGNOSIS — Z6837 Body mass index (BMI) 37.0-37.9, adult: Secondary | ICD-10-CM | POA: Diagnosis not present

## 2022-07-16 DIAGNOSIS — I10 Essential (primary) hypertension: Secondary | ICD-10-CM | POA: Diagnosis not present

## 2022-08-16 DIAGNOSIS — I10 Essential (primary) hypertension: Secondary | ICD-10-CM | POA: Diagnosis not present

## 2022-09-15 DIAGNOSIS — I10 Essential (primary) hypertension: Secondary | ICD-10-CM | POA: Diagnosis not present

## 2022-09-20 DIAGNOSIS — Z1231 Encounter for screening mammogram for malignant neoplasm of breast: Secondary | ICD-10-CM | POA: Diagnosis not present

## 2022-09-26 ENCOUNTER — Ambulatory Visit: Payer: Medicare PPO | Admitting: Neurology

## 2022-09-29 DIAGNOSIS — E669 Obesity, unspecified: Secondary | ICD-10-CM | POA: Diagnosis not present

## 2022-09-29 DIAGNOSIS — Z6836 Body mass index (BMI) 36.0-36.9, adult: Secondary | ICD-10-CM | POA: Diagnosis not present

## 2022-09-29 DIAGNOSIS — R1032 Left lower quadrant pain: Secondary | ICD-10-CM | POA: Diagnosis not present

## 2022-10-02 DIAGNOSIS — R928 Other abnormal and inconclusive findings on diagnostic imaging of breast: Secondary | ICD-10-CM | POA: Diagnosis not present

## 2022-10-02 DIAGNOSIS — R92321 Mammographic fibroglandular density, right breast: Secondary | ICD-10-CM | POA: Diagnosis not present

## 2022-10-02 DIAGNOSIS — R921 Mammographic calcification found on diagnostic imaging of breast: Secondary | ICD-10-CM | POA: Diagnosis not present

## 2022-10-14 DIAGNOSIS — H35033 Hypertensive retinopathy, bilateral: Secondary | ICD-10-CM | POA: Diagnosis not present

## 2022-10-16 DIAGNOSIS — I10 Essential (primary) hypertension: Secondary | ICD-10-CM | POA: Diagnosis not present

## 2022-11-16 DIAGNOSIS — I10 Essential (primary) hypertension: Secondary | ICD-10-CM | POA: Diagnosis not present

## 2022-12-16 DIAGNOSIS — I10 Essential (primary) hypertension: Secondary | ICD-10-CM | POA: Diagnosis not present

## 2022-12-23 ENCOUNTER — Ambulatory Visit: Payer: Medicare PPO | Admitting: Neurology

## 2022-12-26 ENCOUNTER — Ambulatory Visit (INDEPENDENT_AMBULATORY_CARE_PROVIDER_SITE_OTHER): Payer: Medicare PPO | Admitting: Neurology

## 2022-12-26 ENCOUNTER — Encounter: Payer: Self-pay | Admitting: Neurology

## 2022-12-26 VITALS — BP 176/69 | HR 56 | Ht 62.0 in | Wt 222.0 lb

## 2022-12-26 DIAGNOSIS — R030 Elevated blood-pressure reading, without diagnosis of hypertension: Secondary | ICD-10-CM | POA: Diagnosis not present

## 2022-12-26 DIAGNOSIS — R251 Tremor, unspecified: Secondary | ICD-10-CM | POA: Diagnosis not present

## 2022-12-26 DIAGNOSIS — Z8669 Personal history of other diseases of the nervous system and sense organs: Secondary | ICD-10-CM | POA: Diagnosis not present

## 2022-12-26 DIAGNOSIS — Z818 Family history of other mental and behavioral disorders: Secondary | ICD-10-CM | POA: Diagnosis not present

## 2022-12-26 DIAGNOSIS — R413 Other amnesia: Secondary | ICD-10-CM | POA: Diagnosis not present

## 2022-12-26 NOTE — Progress Notes (Addendum)
Subjective:    Patient ID: Kristin Barnes is a 82 y.o. female.  HPI    Huston Foley, MD, PhD [facility] [address], [address] [address] [place], [place] [address]  Dear Thea Silversmith,  I saw your patient, Kristin Barnes, upon your kind request in my neurologic clinic today for initial consultation of her memory loss.  The patient is accompanied by her sister, Kristin Barnes, today as you know, Ms. Kristin Barnes is an 82 year old female with an underlying medical history of reflux disease, trigeminal neuralgia (by chart review), emphysema, allergic rhinitis, hypertension, hyperlipidemia, sleep apnea, arthritis, diabetes, AVM of the small bowel and obesity, who reports forgetfulness for the past couple of years.  She is not very detailed and elaborate in her history, reports that she is forgetful and misplaces things.  She drives.  She has had an incidence of getting lost driving a couple years ago, no details available.  She apparently also took the wrong turn recently per sister. They do have a family history of dementia, 2 sisters with dementia.  She has a large family, she had a total of 8 siblings, 6 of them all together are still alive.  She has 2 grown children.  She is retired, she could not tell me what job she had initially but then she thought of it with the help of some nudging from her sister.  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 She is a non-smoker.  She does not currently drink any alcohol.  She limits her caffeine, does not have to have caffeine every day.  Occasional soda or occasional coffee.  She drinks about 2 bottles of water per day she estimates, 16 ounce size.  She does not have a CPAP machine, patient is unsure if she even had 1 but sister endorses that she had a PAP machine and it was over 10 years ago and the machine was taken back.  I reviewed your office note from 07/05/2022.  She has been on memantine, currently 5 mg twice daily.  She  has been on vitamin B12 supplementation.  She has also been on primidone 50 mg daily for tremor.  She takes medication for bladder hyperactivity.  She is on a beta-blocker as well.  She had blood work through your office in April and I was able to review the results, she had a TSH, which was in the normal range, CBC unremarkable, CMP showed BUN of 11 and creatinine of 0.87, overall normal findings including electrolytes and liver function.  Lipid panel showed benign findings with total cholesterol of 157, triglycerides 70, LDL 79.  Blood work was from 06/25/2022.    Of note, she had a head CT without contrast through [facility] on 03/27/2020 and I have reviewed the results: Impression  Atrophy with small vessel chronic ischemic changes of deep cerebral white matter.  Her Past Medical History Is Significant For: Past Medical History:  Diagnosis Date   Arthritis    hands and finger   Diabetes mellitus without complication (HCC)    GERD (gastroesophageal reflux disease)    uses club soda   Hyperlipidemia    Hypertension    Sleep apnea    states she had a CPAP machine at one time but was picked up...over 70yrs ago    Her Past Surgical History Is Significant For: Past Surgical History:  Procedure Laterality Date   CHOLECYSTECTOMY     COLONOSCOPY WITH PROPOFOL N/A 05/18/2021   Procedure: COLONOSCOPY WITH  PROPOFOL;  Surgeon: Marguerita Merles, Reuel Boom, MD;  [facility];  Service: Gastroenterology;  Laterality: N/A;  200   ESOPHAGOGASTRODUODENOSCOPY (EGD) WITH PROPOFOL N/A 02/26/2022   Procedure: ESOPHAGOGASTRODUODENOSCOPY (EGD) WITH PROPOFOL;  Surgeon: Dolores Frame, MD;  [facility];  Service: Gastroenterology;  Laterality: N/A;  1145 ASA 3   HOT HEMOSTASIS  02/26/2022   Procedure: HOT HEMOSTASIS (ARGON PLASMA COAGULATION/BICAP);  Surgeon: Marguerita Merles, Reuel Boom, MD;  [facility];  Service: Gastroenterology;;   POLYPECTOMY  05/18/2021   Procedure:  POLYPECTOMY;  Surgeon: Marguerita Merles, Reuel Boom, MD;  [facility];  Service: Gastroenterology;;   THYROIDECTOMY Right 08/19/2016   Procedure: RIGHT HEMI-THYROIDECTOMY;  Surgeon: Newman Pies, MD;  Location: [facility];  Service: ENT;  Laterality: Right;   TUBAL LIGATION      Her Family History Is Significant For: Family History  Problem Relation Age of Onset   Diabetes Mother    CVA Mother    CAD Father    Memory loss Sister    Memory loss Sister    Breast cancer Sister     Her Social History Is Significant For: Social History   Socioeconomic History   Marital status: Married    Spouse name: Not on file   Number of children: Not on file   Years of education: Not on file   Highest education level: Not on file  Occupational History   Not on file  Tobacco Use   Smoking status: Never    Passive exposure: Never   Smokeless tobacco: Never  Vaping Use   Vaping status: Never Used  Substance and Sexual Activity   Alcohol use: No   Drug use: No   Sexual activity: Yes  Other Topics Concern   Not on file  Social History Narrative   Not on file   Social Determinants of Health   Financial Resource Strain: Not on file  Food Insecurity: Not on file  Transportation Needs: Not on file  Physical Activity: Not on file  Stress: Not on file  Social Connections: Not on file    Her Allergies Are:  No Known Allergies:   Her Current Medications Are:  Outpatient Encounter Medications as of 12/26/2022  Medication Sig   atorvastatin (LIPITOR) 10 MG tablet Take 10 mg by mouth daily.   Cyanocobalamin (B-12 PO) Take 1 tablet by mouth daily.   Ferrous Sulfate (IRON PO) Take by mouth.   furosemide (LASIX) 20 MG tablet Take 20 mg by mouth daily.   gabapentin (NEURONTIN) 300 MG capsule Take 300 mg by mouth daily.   losartan (COZAAR) 100 MG tablet Take 100 mg by mouth daily.   metoprolol tartrate (LOPRESSOR) 25 MG tablet Take 25 mg by mouth. 2 in the morning and one  in the evening   oxybutynin (DITROPAN XL) 15 MG 24 hr tablet Take 15 mg by mouth at bedtime.   pantoprazole (PROTONIX) 40 MG tablet Take 1 tablet (40 mg total) by mouth daily.   primidone (MYSOLINE) 50 MG tablet Take 50 mg by mouth daily.   ferrous sulfate 325 (65 FE) MG tablet Take 1 tablet (325 mg total) by mouth daily.   No facility-administered encounter medications on file as of 12/26/2022.  :   Review of Systems:  Out of a complete 14 point review of systems, all are reviewed and negative with the exception of these symptoms as listed below:  Review of Systems  Neurological:  Pt here for memory decline Pt states short and long term memory has declined in the last six months     Objective:  Neurological Exam  Physical Exam Physical Examination:   Vitals:   12/26/22 0833  BP: (!) 176/69  Pulse: (!) 56    General Examination: The patient is a very pleasant 82 y.o. female in no acute distress. She appears well-developed and well-nourished and well groomed.   HEENT: Normocephalic, atraumatic, pupils are equal, round and reactive to light, extraocular tracking is good without limitation to gaze excursion or nystagmus noted. Hearing is grossly intact. Face is symmetric with normal facial animation. Speech is clear with no dysarthria noted. There is no hypophonia. There is no lip, neck/head, jaw or voice tremor. Neck is supple with full range of passive and active motion. There are no carotid bruits on auscultation. Oropharynx exam reveals: moderate mouth dryness, adequate dental hygiene with dentures on top and several missing teeth in the back on the bottom.  Moderate airway crowding noted with small airway.  Mallampati class III.  Neck circumference 14 3/8 inches.    Chest: Clear to auscultation without wheezing, rhonchi or crackles noted.  Heart: S1+S2+0, regular and normal without murmurs, rubs or gallops noted.   Abdomen: Soft, non-tender and  non-distended.  Extremities: There is 1+ pitting edema in the distal lower extremities bilaterally.  She wears compression stockings up to the knees bilaterally.  Skin: Warm and dry without trophic changes noted.   Musculoskeletal: exam reveals no obvious joint deformities.   Neurologically:  Mental status: The patient is awake, alert and oriented in all 4 spheres. Her immediate and remote memory, attention, language skills and fund of knowledge are impaired.  She is unable to provide details of her history, sister is able to supplement some.   Thought process is linear. Mood is normal and affect is normal.      12/26/2022    8:36 AM  MMSE - Mini Mental State Exam  Orientation to time 5  Orientation to Place 3  Registration 3  Attention/ Calculation 0  Recall 1  Language- name 2 objects 2  Language- repeat 1  Language- follow 3 step command 3  Language- read & follow direction 0  Write a sentence 0  Copy design 0  Total score 18   On 12/26/2022: CDT: 2/4, AFT: 6/min.  Cranial nerves II - XII are as described above under HEENT exam.  Motor exam: Normal bulk, strength and tone is noted. There is no obvious action or resting tremor.  Intermittent mild bilateral upper extremity postural tremor noted. Fine motor skills and coordination: grossly intact.  Cerebellar testing: No dysmetria or intention tremor. There is no truncal or gait ataxia.  Sensory exam: intact to light touch in the upper and lower extremities.  Reflexes are diminished throughout.  Romberg is not tested due to safety concerns. Gait, station and balance: She stands with mild difficulty and walks with a single-point cane.  No shuffling noted.   Assessment and Plan:  In summary, Kristin Barnes is a very pleasant 82 y.o.-year old female with an underlying medical history of reflux disease, hypertension, hyperlipidemia, sleep apnea, arthritis, diabetes, AVM of the small bowel and obesity, who presents for evaluation of  her memory loss of approximately 2 to 3 years duration.  She is currently on Namenda 5 mg twice daily.  If tolerable, I recommend that she consider increasing it to 10 mg twice daily.  She is advised to follow-up with  you to discuss medication management with current medications: She is advised to talk to about the primidone, while it is for tremor control, she is also on a beta-blocker and primidone can affect balance, cause sleepiness and also sluggishness and cognitive function, I would recommend considering taking it off.  She is also on gabapentin, she is not sure why she is taking it, her chart indicates that she has a history of trigeminal neuralgia, I would recommend considering decreasing it or taking it off if possible for similar reasons as the primidone.  She is advised to increase her water intake to about 3 bottles of water per day.  We will proceed with a brain MRI to look for structural changes and we will proceed with a sleep study to reevaluate her sleep apnea as untreated obstructive sleep apnea can increase cardiovascular risk and neurovascular risk including TIA, stroke, and dementia.  She has a family history of dementia, memory scores in the moderately abnormal range at this time.  We talked about the importance of lifestyle modification.  We may have to be cautious with future use of Aricept since she has bradycardia.  Nevertheless, you can consider increasing her memantine at the next visit while also discussing her other medications such as gabapentin and primidone as discussed above.  We will plan a follow-up after testing.  We will keep her posted as to her test results by phone call in the interim.  In addition, she is advised to discuss her driving with you and her family.  I am not sure that she is fully safe to drive given her abnormal memory scores and particularly her visual-spatial challenges on clock drawing.  She has in fact gotten lost once in the past and also recently took a  wrong turn per sister.  I answered all the questions today and the patient and her sister were in agreement with our approach.   This was an extended visit of over 60 minutes given extensive chart review, counseling and coordination of care, and addressing multiple problems.  Thank you very much for allowing me to participate in the care of this nice patient. If I can be of any further assistance to you please do not hesitate to call me at [phone].  Sincerely,   Huston Foley, MD, PhD

## 2022-12-26 NOTE — Patient Instructions (Addendum)
It was nice to meet you today.  You have complaints of memory loss: memory loss or changes in cognitive function can have many reasons and does not always mean you have dementia.  There are several conditions and situations that can contribute to subjective or objective memory loss.  These factors include: depression, stress, sleep deprivation or poor sleep from insomnia or sleep apnea, dehydration, fluctuation in blood sugar values, thyroid or electrolyte dysfunction, medication effects from sedating medications or narcotic pain medication for example and certain vitamin deficiencies such as vitamin B12 deficiency, and anemia. Dementia can be caused by stroke, brain atherosclerosis or brain vascular disease due to vascular risk factors (smoking, high blood pressure, high cholesterol, obesity and uncontrolled diabetes), certain degenerative brain disorders (including Parkinson's disease and Multiple sclerosis) and by Alzheimer's disease or other, more rare and sometimes hereditary causes.   Here is what I would recommend:   blood work (which we will do today).  We will do a brain scan, called MRI and call you with the test results. We will have to schedule you for this on a separate date. This test requires authorization from your insurance, and we will take care of the insurance process. Please talk to your primary care about the gabapentin and primidone. These medications can cause your memory function to be more sluggish. We may consider an additional medication for memory loss in the near future.    We will keep you posted as to your test results by phone call for now.  We will plan a follow-up after testing.   I recommend we proceed with a sleep study to rule out obstructive sleep apnea.  If you have obstructive sleep apnea I will likely recommend treatment with a CPAP or AutoPap machine. I am worried about your driving, I am not sure that you are safe to drive. Please discuss with your family and  your family doctor.  8. Please increase your water intake to about 3 bottles per day.

## 2022-12-27 LAB — COMPREHENSIVE METABOLIC PANEL
ALT: 13 [IU]/L (ref 0–32)
AST: 25 [IU]/L (ref 0–40)
Albumin: 4 g/dL (ref 3.7–4.7)
Alkaline Phosphatase: 151 [IU]/L — ABNORMAL HIGH (ref 44–121)
BUN/Creatinine Ratio: 12 (ref 12–28)
BUN: 10 mg/dL (ref 8–27)
Bilirubin Total: 0.5 mg/dL (ref 0.0–1.2)
CO2: 24 mmol/L (ref 20–29)
Calcium: 9.1 mg/dL (ref 8.7–10.3)
Chloride: 101 mmol/L (ref 96–106)
Creatinine, Ser: 0.82 mg/dL (ref 0.57–1.00)
Globulin, Total: 3 g/dL (ref 1.5–4.5)
Glucose: 89 mg/dL (ref 70–99)
Potassium: 4.2 mmol/L (ref 3.5–5.2)
Sodium: 140 mmol/L (ref 134–144)
Total Protein: 7 g/dL (ref 6.0–8.5)
eGFR: 72 mL/min/{1.73_m2} (ref >59)

## 2022-12-27 LAB — B12 AND FOLATE PANEL
Folate: 14.7 ng/mL (ref >3.0)
Vitamin B-12: 764 pg/mL (ref 232–1245)

## 2022-12-27 LAB — TSH: TSH: 3.75 u[IU]/mL (ref 0.450–4.500)

## 2022-12-27 LAB — HGB A1C W/O EAG: Hgb A1c MFr Bld: 6.3 % — ABNORMAL HIGH (ref 4.8–5.6)

## 2022-12-30 ENCOUNTER — Telehealth: Payer: Self-pay | Admitting: Neurology

## 2022-12-30 NOTE — Telephone Encounter (Signed)
Ethlyn Gallery: [phone] exp. 12/30/22-02/28/23 sent to GI [phone]

## 2023-01-02 ENCOUNTER — Telehealth: Payer: Self-pay | Admitting: *Deleted

## 2023-01-02 NOTE — Telephone Encounter (Signed)
-----   Message from Huston Foley sent at 12/30/2022  5:19 PM EDT ----- Labs benign with the exception of one liver enzyme mildly elevated. This is a non-specific finding and in isolation not alarming; can be monitored by PCP with next blood work.

## 2023-01-02 NOTE — Telephone Encounter (Signed)
Spoke to pt gave labwork results Pt aware to get labs redrawn at PCP due to liver enzyme mildly elevated Pt states her f/u with PCP is February 2025  Per Dr Frances Furbish ok to repeat labwork than Pt expressed understanding and thanked me for calling  sent lab results to PCP

## 2023-01-14 ENCOUNTER — Telehealth: Payer: Self-pay | Admitting: Neurology

## 2023-01-14 NOTE — Telephone Encounter (Signed)
NPSG- [employer] Harvey: [id number] (exp. 12/31/22 to 04/17/23)   Patient is scheduled at [facility] for 02/25/23 at 8 pm.  Mailed packet to the patient.

## 2023-01-15 DIAGNOSIS — I10 Essential (primary) hypertension: Secondary | ICD-10-CM | POA: Diagnosis not present

## 2023-02-07 ENCOUNTER — Ambulatory Visit
Admission: RE | Admit: 2023-02-07 | Discharge: 2023-02-07 | Disposition: A | Discharge status: Home / Self Care | Payer: Medicare PPO | Source: Ambulatory Visit | Attending: Neurology | Admitting: Neurology

## 2023-02-07 DIAGNOSIS — R413 Other amnesia: Secondary | ICD-10-CM | POA: Diagnosis not present

## 2023-02-07 DIAGNOSIS — Z818 Family history of other mental and behavioral disorders: Secondary | ICD-10-CM

## 2023-02-07 DIAGNOSIS — R030 Elevated blood-pressure reading, without diagnosis of hypertension: Secondary | ICD-10-CM

## 2023-02-07 DIAGNOSIS — Z8669 Personal history of other diseases of the nervous system and sense organs: Secondary | ICD-10-CM | POA: Diagnosis not present

## 2023-02-07 MED ORDER — GADOPICLENOL 0.5 MMOL/ML IV SOLN
10.0000 mL | Freq: Once | INTRAVENOUS | Status: AC | PRN
  Administered 2023-02-07: 10 mL via INTRAVENOUS

## 2023-02-11 ENCOUNTER — Telehealth: Payer: Self-pay | Admitting: *Deleted

## 2023-02-11 NOTE — Telephone Encounter (Signed)
Spoke to daughter(checked DPR)  reviewed  Mri brain  results . Gave Dr Johny Sax recommendation. Daughter expressed understanding and thanked me for calling

## 2023-02-11 NOTE — Telephone Encounter (Signed)
-----   Message from Huston Foley sent at 02/10/2023  7:50 AM EST ----- Please call patient's daughter or family member on DPR regarding the recent brain MRI.  There were no acute findings.  She does have chronic changes including volume loss which we call atrophy.  She also has changes in keeping with hardening of the arteries and chronic changes that can be from underlying blood pressure or cholesterol or diabetes.  She had 3 small areas of chronic blood in the front.  We call this microhemorrhage.  These are tiny amounts of blood.  This is not a new finding on recent bleed.  Microhemorrhages are nonspecific but can be seen in patients with dementia.  These are typically not from any trauma or prior injury.  Her brain MRI findings, in general, can be seen in patients with memory loss and would support a diagnosis of dementia.  She can follow-up in our clinic as scheduled.

## 2023-02-25 ENCOUNTER — Ambulatory Visit (INDEPENDENT_AMBULATORY_CARE_PROVIDER_SITE_OTHER): Payer: Medicare PPO | Admitting: Neurology

## 2023-02-25 DIAGNOSIS — G4733 Obstructive sleep apnea (adult) (pediatric): Secondary | ICD-10-CM

## 2023-02-25 DIAGNOSIS — Z818 Family history of other mental and behavioral disorders: Secondary | ICD-10-CM

## 2023-02-25 DIAGNOSIS — R030 Elevated blood-pressure reading, without diagnosis of hypertension: Secondary | ICD-10-CM

## 2023-02-25 DIAGNOSIS — Z8669 Personal history of other diseases of the nervous system and sense organs: Secondary | ICD-10-CM

## 2023-02-25 DIAGNOSIS — G4720 Circadian rhythm sleep disorder, unspecified type: Secondary | ICD-10-CM

## 2023-02-25 DIAGNOSIS — R413 Other amnesia: Secondary | ICD-10-CM

## 2023-02-27 NOTE — Addendum Note (Signed)
Addended by: Huston Foley on: 02/27/2023 04:55 PM   Modules accepted: Orders

## 2023-02-27 NOTE — Procedures (Signed)
Physician Interpretation:     [facility] POLYSOMNOGRAPHY  INTERPRETATION REPORT   STUDY DATE:  02/25/2023     PATIENT NAME:  Kristin Barnes         DATE OF BIRTH:  [birth year]  PATIENT ID:  [id number]    TYPE OF STUDY:  PSG  READING PHYSICIAN: Huston Foley, MD, PhD   SCORING TECHNICIAN: Margaretann Loveless, RPSGT   Referred by: Sandrea Matte, NP ? History and Indication for Testing: 82 year old female with an underlying medical history of reflux disease, trigeminal neuralgia (by chart review), emphysema, allergic rhinitis, hypertension, hyperlipidemia, sleep apnea, arthritis, diabetes, AVM of the small bowel and obesity, who reports forgetfulness. She was diagnosed with OSA several years ago, but no longer uses a PAP machine. She presents for re-evaluation. Height: 62 in Weight: 222 lb (BMI 40) Neck Size: 14 in    MEDICATIONS: Lipitor, Vitamin B 12, Iron, Gabapentin, Cozaar, Lopressor, Ditropan XL, Protonix, Mysoline, Ferrous Sulfate   TECHNICAL DESCRIPTION: A registered sleep technologist  was in attendance for the duration of the recording.  Data collection, scoring, video monitoring, and reporting were performed in compliance with the AASM Manual for the Scoring of Sleep and Associated Events; (Hypopnea is scored based on the criteria listed in Section VIII D. 1b in the AASM Manual V2.6 using a 4% oxygen desaturation rule or Hypopnea is scored based on the criteria listed in Section VIII D. 1a in the AASM Manual V2.6 using 3% oxygen desaturation and /or arousal rule).   SLEEP CONTINUITY AND SLEEP ARCHITECTURE:  Lights-out was at 21:01: and lights-on at  04:44:, with a total recording time of 7 hours, 42 min. Total sleep time ( TST) was 303.0 minutes with a decreased sleep efficiency at 65.6%. There was  8.9% REM sleep.    BODY POSITION:  TST was divided  between the following sleep positions: 97.5% supine;  2.5% lateral;  0% prone. Duration of total sleep  and percent of total sleep in their respective position is as follows: supine 295 minutes (98%), non-supine 8 minutes (2%); right 07 minutes (2%), left 00 minutes (0%), and prone 00 minutes (0%).  Total supine REM sleep time was 27 minutes (100% of total REM sleep).  Sleep latency was decreased at 4.0 minutes.  REM sleep latency was markedly increased at 402.0 minutes. Of the total sleep time, the percentage of stage N1 sleep was 11.4%, which is increased, stage N2 sleep was 73%, which is increased, stage N3 sleep was 6.3%, and REM sleep was 8.9%, which is reduced. Wake after sleep onset (WASO) time accounted for 155 minutes, with moderate sleep fragmentation noted, and 2 longer periods of wakefulness.   RESPIRATORY MONITORING:   Based on CMS criteria (using a 4% oxygen desaturation rule for scoring hypopneas), there were 14 apneas (14 obstructive; 0 central; 0 mixed), and 43 hypopneas.  Apnea index was 2.8. Hypopnea index was 8.5. The apnea-hypopnea index was 11.3/hour overall (11.6 supine, 0 non-supine; 57.8 REM, 57.8 supine REM).  There were 0 respiratory effort-related arousals (RERAs).  The RERA index was 0 events/h. Total respiratory disturbance index (RDI) was 11.3 events/h. RDI results showed: supine RDI  11.6 /h; non-supine RDI 0.0 /h; REM RDI 57.8 /h, supine REM RDI 57.8 /h.   Based on AASM criteria (using a 3% oxygen desaturation and /or arousal rule for scoring hypopneas), there were 14 apneas (14 obstructive; 0 central; 0 mixed), and 43 hypopneas. Apnea index was 2.8. Hypopnea index was 8.5. The apnea-hypopnea  index was 11.3 overall (11.6 supine, 0 non-supine; 57.8 REM, 57.8 supine REM).  There were 0 respiratory effort-related arousals (RERAs).  The RERA index was 0 events/h. Total respiratory disturbance index (RDI) was 11.3 events/h. RDI results showed: supine RDI  11.6 /h; non-supine RDI 0.0 /h; REM RDI 57.8 /h, supine REM RDI 57.8 /h.    OXIMETRY: Oxyhemoglobin Saturation Nadir during  sleep was at  70% from a mean of 94%.  Of the Total sleep time (TST)   hypoxemia (=<88%) was present for  9.6 minutes, or 3.2% of total sleep time.   LIMB MOVEMENTS: There were 31 periodic limb movements of sleep (6.1/hr), of which 0 (0.0/hr) were associated with an arousal.   AROUSAL: There were 43 arousals in total, for an arousal index of 9 arousals/hour.  Of these, 12 were identified as respiratory-related arousals (2 /h), 0 were PLM-related arousals (0 /h), and 45 were non-specific arousals (9 /h).   EEG: Review of the EEG showed no abnormal electrical discharges and symmetrical bihemispheric findings.    EKG: The EKG revealed normal sinus rhythm (NSR). The average heart rate during sleep was 63 bpm.  AUDIO/VIDEO REVIEW: The audio and video review did not show any abnormal or unusual behaviors, movements, phonations or vocalizations. The patient took 2 restroom breaks. Snoring was noted, in the mild to moderate range.  POST-STUDY QUESTIONNAIRE: Post study, the patient indicated, that sleep was the same as usual.   IMPRESSION:   1. Obstructive Sleep Apnea (OSA) 2. Dysfunctions associated with sleep stages or arousal from sleep  RECOMMENDATIONS:   1. This study demonstrates overall mild obstructive sleep apnea, severe in REM sleep with a total AHI of 11.3/hour, REM AHI of 57.8/hour, supine AHI of 11.6/hour and O2 nadir of 70% (during supine REM sleep). Given the patient's medical history and sleep related complaints, treatment with positive airway pressure is recommended; this can be achieved in the form of autoPAP. A full-night CPAP titration study would allow optimization of therapy if needed. Other treatment options may include avoidance of supine sleep position along with weight loss, or the use of an oral appliance in selected patients. Please note, that untreated obstructive sleep apnea may carry additional perioperative morbidity. Patients with significant obstructive sleep apnea  should receive perioperative PAP therapy and the surgeons and particularly the anesthesiologist should be informed of the diagnosis and the severity of the sleep disordered breathing. 2. This study shows significant sleep fragmentation and abnormal sleep stage percentages; these are nonspecific findings and per se do not signify an intrinsic sleep disorder or a cause for the patient's sleep-related symptoms. Causes include (but are not limited to) the first night effect of the sleep study, circadian rhythm disturbances, medication effect or an underlying mood disorder or medical problem.  3. The patient should be cautioned not to drive, work at heights, or operate dangerous or heavy equipment when tired or sleepy. Review and reiteration of good sleep hygiene measures should be pursued with any patient. 4. The patient will be seen in follow-up by Dr. Frances Furbish at [facility] for discussion of the test results and further management strategies. The referring provider will be notified of the test results.   I certify that I have reviewed the entire raw data recording prior to the issuance of this report in accordance with the Standards of Accreditation of the American Academy of Sleep Medicine (AASM).  Huston Foley, MD, PhD Medical Director, [facility]) Diplomat, ABPN (Neurology and Sleep)  Technical Report:   General Information  Name: Kristin Barnes, Kristin Barnes BMI: 40.60 Physician: Huston Foley, MD  ID: [id number] Height: 62.0 in Technician: Margaretann Loveless, RPSGT  Sex: Female Weight: 222.0 lb Record: [id number]  Age: 44 [[birth year]] Date: 02/25/2023    Medical & Medication History    82 year old female with an underlying medical history of reflux disease, trigeminal neuralgia (by chart review), emphysema, allergic rhinitis, hypertension, hyperlipidemia, sleep apnea, arthritis, diabetes, AVM of the small bowel and obesity, who reports forgetfulness for the  past couple of years. She is not very detailed and elaborate in her history, reports that she is forgetful and misplaces things. She drives. She has had an incidence of getting lost driving a couple years ago, no details available. She apparently also took the wrong turn recently per sister. Lipitor, Vitamin B 12, Iron, Gabapentin, Cozaar, Lopressor, Ditropan XL, Protonix, Mysoline, Ferrous Sulfate   Sleep Disorder      Comments   The patient came into the sleep lab for a PSG. Per the patient she did not take any nighttime medications. Two restroom breaks. EKG did not show any obvious cardiac arrhythmias. Mild to moderate snoring. Respiratory events scored with a 4% desat. Majority of respiratory events while in REM supine. The patient slept supine and lateral. The patient did make some noise while sleeping. PLM's noted.     Lights out: 09:01:54 PM Lights on: 04:44:02 AM   Time Total Supine Side Prone Upright  Recording (TRT) 7h 42.16m 7h 27.74m 0h 15.44m 0h 0.44m 0h 0.13m  Sleep (TST) 5h 3.85m 4h 55.82m 0h 7.40m 0h 0.39m 0h 0.31m   Latency N1 N2 N3 REM Onset Per. Slp. Eff.  Actual 0h 0.78m 0h 17.2m 3h 15.55m 6h 42.28m 0h 4.47m 0h 27.59m 65.58%   Stg Dur Wake N1 N2 N3 REM  Total 159.0 34.5 222.5 19.0 27.0  Supine 151.5 34.5 215.0 19.0 27.0  Side 7.5 0.0 7.5 0.0 0.0  Prone 0.0 0.0 0.0 0.0 0.0  Upright 0.0 0.0 0.0 0.0 0.0   Stg % Wake N1 N2 N3 REM  Total 34.4 11.4 73.4 6.3 8.9  Supine 32.8 11.4 71.0 6.3 8.9  Side 1.6 0.0 2.5 0.0 0.0  Prone 0.0 0.0 0.0 0.0 0.0  Upright 0.0 0.0 0.0 0.0 0.0     Apnea Summary Sub Supine Side Prone Upright  Total 14 Total 14 14 0 0 0    REM 8 8 0 0 0    NREM 6 6 0 0 0  Obs 14 REM 8 8 0 0 0    NREM 6 6 0 0 0  Mix 0 REM 0 0 0 0 0    NREM 0 0 0 0 0  Cen 0 REM 0 0 0 0 0    NREM 0 0 0 0 0   Rera Summary Sub Supine Side Prone Upright  Total 0 Total 0 0 0 0 0    REM 0 0 0 0 0    NREM 0 0 0 0 0   Hypopnea Summary Sub Supine Side Prone Upright  Total 43 Total 43 43 0  0 0    REM 18 18 0 0 0    NREM 25 25 0 0 0   4% Hypopnea Summary Sub Supine Side Prone Upright  Total (4%) 43 Total 43 43 0 0 0    REM 18 18 0 0 0    NREM 25 25 0 0 0     AHI Total Obs Mix  Cen  11.29 Apnea 2.77 2.77 0.00 0.00   Hypopnea 8.51 -- -- --  11.29 Hypopnea (4%) 8.51 -- -- --    Total Supine Side Prone Upright  Position AHI 11.29 11.57 0.00 0.00 0.00  REM AHI 57.78   NREM AHI 6.74   Position RDI 11.29 11.57 0.00 0.00 0.00  REM RDI 57.78   NREM RDI 6.74    4% Hypopnea Total Supine Side Prone Upright  Position AHI (4%) 11.29 11.57 0.00 0.00 0.00  REM AHI (4%) 57.78   NREM AHI (4%) 6.74   Position RDI (4%) 11.29 11.57 0.00 0.00 0.00  REM RDI (4%) 57.78   NREM RDI (4%) 6.74    Desaturation Information Threshold: 2% <100% <90% <80% <70% <60% <50% <40%  Supine 193.0 30.0 7.0 0.0 0.0 0.0 0.0  Side 2.0 0.0 0.0 0.0 0.0 0.0 0.0  Prone 0.0 0.0 0.0 0.0 0.0 0.0 0.0  Upright 0.0 0.0 0.0 0.0 0.0 0.0 0.0  Total 195.0 30.0 7.0 0.0 0.0 0.0 0.0  Index 25.9 4.0 0.9 0.0 0.0 0.0 0.0   Threshold: 3% <100% <90% <80% <70% <60% <50% <40%  Supine 86.0 30.0 7.0 0.0 0.0 0.0 0.0  Side 1.0 0.0 0.0 0.0 0.0 0.0 0.0  Prone 0.0 0.0 0.0 0.0 0.0 0.0 0.0  Upright 0.0 0.0 0.0 0.0 0.0 0.0 0.0  Total 87.0 30.0 7.0 0.0 0.0 0.0 0.0  Index 11.5 4.0 0.9 0.0 0.0 0.0 0.0   Threshold: 4% <100% <90% <80% <70% <60% <50% <40%  Supine 58.0 30.0 7.0 0.0 0.0 0.0 0.0  Side 0.0 0.0 0.0 0.0 0.0 0.0 0.0  Prone 0.0 0.0 0.0 0.0 0.0 0.0 0.0  Upright 0.0 0.0 0.0 0.0 0.0 0.0 0.0  Total 58.0 30.0 7.0 0.0 0.0 0.0 0.0  Index 7.7 4.0 0.9 0.0 0.0 0.0 0.0   Threshold: 3% <100% <90% <80% <70% <60% <50% <40%  Supine 86 30 7 0 0 0 0  Side 1 0 0 0 0 0 0  Prone 0 0 0 0 0 0 0  Upright 0 0 0 0 0 0 0  Total 87 30 7 0 0 0 0   Awakening/Arousal Information # of Awakenings 15  Wake after sleep onset 155.11m  Wake after persistent sleep 148.22m   Arousal Assoc. Arousals Index  Apneas 2 0.4  Hypopneas 10 2.0  Leg  Movements 1 0.2  Snore 0 0.0  PTT Arousals 0 0.0  Spontaneous 45 8.9  Total 58 11.5  Leg Movement Information PLMS LMs Index  Total LMs during PLMS 31 6.1  LMs w/ Microarousals 0 0.0   LM LMs Index  w/ Microarousal 1 0.2  w/ Awakening 0 0.0  w/ Resp Event 0 0.0  Spontaneous 16 3.2  Total 17 3.4     Desaturation threshold setting: 3% Minimum desaturation setting: 10 seconds SaO2 nadir: 70% The longest event was a 63 sec obstructive Hypopnea with a minimum SaO2 of 77%. The lowest SaO2 was 70% associated with a 22 sec obstructive Apnea. EKG Rates EKG Avg Max Min  Awake 63 108 49  Asleep 63 77 51  EKG Events: N/A

## 2023-03-06 ENCOUNTER — Telehealth: Payer: Self-pay | Admitting: *Deleted

## 2023-03-06 NOTE — Telephone Encounter (Signed)
-----   Message from Huston Foley sent at 02/27/2023  4:55 PM EST ----- Patient referred by PCP, seen by me on 12/26/2022, diagnostic PSG on 02/25/2023.    Please call and notify the patient that the recent sleep study did confirm the diagnosis of obstructive sleep apnea. OSA is overall mild, but gets significantly worse during dream sleep with some severe desaturations during REM sleep.  I recommend treatment in the form of autoPAP, which means, that we don't have to bring her back for a second sleep study with CPAP, but will let him try an autoPAP machine at home, through a DME company (of her choice, or as per insurance requirement). The DME representative will educate her on how to use the machine, how to put the mask on, etc. I have placed an order in the chart. Please send referral, talk to patient, send report to referring MD. We will need a FU in sleep clinic for 10 weeks post-PAP set up, please arrange that with me or one of our NPs. Thanks,   Huston Foley, MD, PhD [facility])

## 2023-03-06 NOTE — Telephone Encounter (Signed)
I spoke with the patient's daughter Ladean Raya (on [place]) and discussed the sleep study results as noted below by Dr. Frances Furbish.  She verbalized understanding and is amenable to proceeding with AutoPap for the patient.  We discussed the insurance compliance requirements which includes using the machine at least 4 hours at night and also being seen by our office between 30 and 90 days after set up on the machine.  She asked for a [place] location for DME.  I said we could refer to [facility] but if they do not take her insurance we will then refer to [facility].   Referral sent to [facility]. Sleep study report sent to referring provider.

## 2023-03-17 DIAGNOSIS — S0990XA Unspecified injury of head, initial encounter: Secondary | ICD-10-CM | POA: Diagnosis not present

## 2023-03-17 DIAGNOSIS — I6523 Occlusion and stenosis of bilateral carotid arteries: Secondary | ICD-10-CM | POA: Diagnosis not present

## 2023-03-17 DIAGNOSIS — Z79899 Other long term (current) drug therapy: Secondary | ICD-10-CM | POA: Diagnosis not present

## 2023-03-17 DIAGNOSIS — R159 Full incontinence of feces: Secondary | ICD-10-CM | POA: Diagnosis not present

## 2023-03-17 DIAGNOSIS — W0110XA Fall on same level from slipping, tripping and stumbling with subsequent striking against unspecified object, initial encounter: Secondary | ICD-10-CM | POA: Diagnosis not present

## 2023-03-17 DIAGNOSIS — Z7982 Long term (current) use of aspirin: Secondary | ICD-10-CM | POA: Diagnosis not present

## 2023-03-17 DIAGNOSIS — R519 Headache, unspecified: Secondary | ICD-10-CM | POA: Diagnosis not present

## 2023-03-17 DIAGNOSIS — M542 Cervicalgia: Secondary | ICD-10-CM | POA: Diagnosis not present

## 2023-03-17 DIAGNOSIS — I10 Essential (primary) hypertension: Secondary | ICD-10-CM | POA: Diagnosis not present

## 2023-03-17 DIAGNOSIS — Z043 Encounter for examination and observation following other accident: Secondary | ICD-10-CM | POA: Diagnosis not present

## 2023-03-17 DIAGNOSIS — R059 Cough, unspecified: Secondary | ICD-10-CM | POA: Diagnosis not present

## 2023-03-20 NOTE — Telephone Encounter (Signed)
 Burna Mortimer from [facility] reports they are out of network for [employer], pt wants to go [facility].  If there are questions Burna Mortimer can be reached at [phone]

## 2023-03-20 NOTE — Telephone Encounter (Signed)
 Ok as previously discussed with pt's daughter, I have sent the referral to [facility]. Daughter, Ladean Raya, is aware.

## 2023-03-24 DIAGNOSIS — I10 Essential (primary) hypertension: Secondary | ICD-10-CM | POA: Diagnosis not present

## 2023-03-24 DIAGNOSIS — J439 Emphysema, unspecified: Secondary | ICD-10-CM | POA: Diagnosis not present

## 2023-03-24 DIAGNOSIS — M179 Osteoarthritis of knee, unspecified: Secondary | ICD-10-CM | POA: Diagnosis not present

## 2023-03-24 DIAGNOSIS — R296 Repeated falls: Secondary | ICD-10-CM | POA: Diagnosis not present

## 2023-03-24 DIAGNOSIS — Z299 Encounter for prophylactic measures, unspecified: Secondary | ICD-10-CM | POA: Diagnosis not present

## 2023-03-24 DIAGNOSIS — I739 Peripheral vascular disease, unspecified: Secondary | ICD-10-CM | POA: Diagnosis not present

## 2023-03-24 NOTE — Telephone Encounter (Signed)
 New, Maryella Shivers, Otilio Jefferson, RN; Alain Honey; Darcel Smalling; 1 other Received, thank you!

## 2023-04-04 DIAGNOSIS — G4733 Obstructive sleep apnea (adult) (pediatric): Secondary | ICD-10-CM | POA: Diagnosis not present

## 2023-04-16 DIAGNOSIS — I10 Essential (primary) hypertension: Secondary | ICD-10-CM | POA: Diagnosis not present

## 2023-04-26 ENCOUNTER — Other Ambulatory Visit (INDEPENDENT_AMBULATORY_CARE_PROVIDER_SITE_OTHER): Payer: Self-pay | Admitting: Gastroenterology

## 2023-05-01 DIAGNOSIS — R030 Elevated blood-pressure reading, without diagnosis of hypertension: Secondary | ICD-10-CM | POA: Diagnosis not present

## 2023-05-01 DIAGNOSIS — R079 Chest pain, unspecified: Secondary | ICD-10-CM | POA: Diagnosis not present

## 2023-05-05 DIAGNOSIS — I10 Essential (primary) hypertension: Secondary | ICD-10-CM | POA: Diagnosis not present

## 2023-05-05 DIAGNOSIS — G4733 Obstructive sleep apnea (adult) (pediatric): Secondary | ICD-10-CM | POA: Diagnosis not present

## 2023-05-05 DIAGNOSIS — F03A0 Unspecified dementia, mild, without behavioral disturbance, psychotic disturbance, mood disturbance, and anxiety: Secondary | ICD-10-CM | POA: Diagnosis not present

## 2023-05-05 DIAGNOSIS — M940 Chondrocostal junction syndrome [Tietze]: Secondary | ICD-10-CM | POA: Diagnosis not present

## 2023-05-05 DIAGNOSIS — R079 Chest pain, unspecified: Secondary | ICD-10-CM | POA: Diagnosis not present

## 2023-05-05 DIAGNOSIS — Z299 Encounter for prophylactic measures, unspecified: Secondary | ICD-10-CM | POA: Diagnosis not present

## 2023-05-05 DIAGNOSIS — I739 Peripheral vascular disease, unspecified: Secondary | ICD-10-CM | POA: Diagnosis not present

## 2023-05-12 DIAGNOSIS — R079 Chest pain, unspecified: Secondary | ICD-10-CM | POA: Diagnosis not present

## 2023-05-16 DIAGNOSIS — I10 Essential (primary) hypertension: Secondary | ICD-10-CM | POA: Diagnosis not present

## 2023-05-29 DIAGNOSIS — J439 Emphysema, unspecified: Secondary | ICD-10-CM | POA: Diagnosis not present

## 2023-05-29 DIAGNOSIS — R531 Weakness: Secondary | ICD-10-CM | POA: Diagnosis not present

## 2023-05-29 DIAGNOSIS — F03A0 Unspecified dementia, mild, without behavioral disturbance, psychotic disturbance, mood disturbance, and anxiety: Secondary | ICD-10-CM | POA: Diagnosis not present

## 2023-05-29 DIAGNOSIS — M79606 Pain in leg, unspecified: Secondary | ICD-10-CM | POA: Diagnosis not present

## 2023-06-02 DIAGNOSIS — G4733 Obstructive sleep apnea (adult) (pediatric): Secondary | ICD-10-CM | POA: Diagnosis not present

## 2023-06-02 NOTE — Progress Notes (Unsigned)
 Marland Kitchen

## 2023-06-02 NOTE — Patient Instructions (Incomplete)
 Below is our plan:  We will continue to monitor memory. We may add donepezil in the future but I want you to focus on using your CPAP first.   Please use your CPAP regularly. While your insurance requires that you use CPAP at least 4 hours each night on 70% of the nights, I recommend, that you not skip any nights and use it throughout the night if you can. Getting used to CPAP and staying with the treatment long term does take time and patience and discipline. Untreated obstructive sleep apnea when it is moderate to severe can have an adverse impact on cardiovascular health and raise her risk for heart disease, arrhythmias, hypertension, congestive heart failure, stroke and diabetes. Untreated obstructive sleep apnea causes sleep disruption, nonrestorative sleep, and sleep deprivation. This can have an impact on your day to day functioning and cause daytime sleepiness and impairment of cognitive function, memory loss, mood disturbance, and problems focussing. Using CPAP regularly can improve these symptoms.  Please make sure you are staying well hydrated. I recommend 50-60 ounces daily. Well balanced diet and regular exercise encouraged. Consistent sleep schedule with 6-8 hours recommended.   Please continue follow up with care team as directed.   Follow up with me in 4-6  months   You may receive a survey regarding today's visit. I encourage you to leave honest feed back as I do use this information to improve patient care. Thank you for seeing me today!   Management of Memory Problems   There are some general things you can do to help manage your memory problems.  Your memory may not in fact recover, but by using techniques and strategies you will be able to manage your memory difficulties better.   1)  Establish a routine. Try to establish and then stick to a regular routine.  By doing this, you will get used to what to expect and you will reduce the need to rely on your memory.  Also, try to  do things at the same time of day, such as taking your medication or checking your calendar first thing in the morning. Think about think that you can do as a part of a regular routine and make a list.  Then enter them into a daily planner to remind you.  This will help you establish a routine.   2)  Organize your environment. Organize your environment so that it is uncluttered.  Decrease visual stimulation.  Place everyday items such as keys or cell phone in the same place every day (ie.  Basket next to front door) Use post it notes with a brief message to yourself (ie. Turn off light, lock the door) Use labels to indicate where things go (ie. Which cupboards are for food, dishes, etc.) Keep a notepad and pen by the telephone to take messages   3)  Memory Aids A diary or journal/notebook/daily planner Making a list (shopping list, chore list, to do list that needs to be done) Using an alarm as a reminder (kitchen timer or cell phone alarm) Using cell phone to store information (Notes, Calendar, Reminders) Calendar/White board placed in a prominent position Post-it notes   In order for memory aids to be useful, you need to have good habits.  It's no good remembering to make a note in your journal if you don't remember to look in it.  Try setting aside a certain time of day to look in journal.   4)  Improving mood and managing  fatigue. There may be other factors that contribute to memory difficulties.  Factors, such as anxiety, depression and tiredness can affect memory. Regular gentle exercise can help improve your mood and give you more energy. Exercise: there are short videos created by the [employer] on Health specially for older adults: https://bit.ly/2I30q97.  Mediterranean diet: which emphasizes fruits, vegetables, whole grains, legumes, fish, and other seafood; unsaturated fats such as olive oils; and low amounts of red meat, eggs, and sweets. A variation of this, called MIND  (Mediterranean-DASH Intervention for Neurodegenerative Delay) incorporates the DASH (Dietary Approaches to Stop Hypertension) diet, which has been shown to lower high blood pressure, a risk factor for Alzheimer's disease. More information at: ExitMarketing.de.  Aerobic exercise that improve heart health is also good for the mind.  [employer] on Aging have short videos for exercises that you can do at home: BlindWorkshop.com.pt Simple relaxation techniques may help relieve symptoms of anxiety Try to get back to completing activities or hobbies you enjoyed doing in the past. Learn to pace yourself through activities to decrease fatigue. Find out about some local support groups where you can share experiences with others. Try and achieve 7-8 hours of sleep at night.   Resources for Family/Caregiver  Online caregiver support groups can be found at WesternTunes.it or call Alzheimer's Association's 24/7 hotline: 713-634-6157. [facility] offers in-person, virtual support groups and individual counseling for both care partners and persons with memory loss. Call for more information at [phone].   Advanced care plan: there are two types of Power of Attorney: healthcare and durable. Healthcare POA is a designated person to make healthcare decisions on your behalf if you were too sick to make them yourself. This person can be selected and documented by your physician. Durable POA has to be set up with a lawyer who takes charge of your finances and estate if you were too sick or cognitively impaired to manage your finances accurately. You can find a local Elder Therapist, art here: NewportRanch.at.  Check out www.planyourlifespan.org, which will help you plan before a crisis and decide who will take care of life considerations in a circumstance where you may not be able to speak for yourself.    Helpful books (available on [employer] or your local bookstore):  By Dr. Carl Best: Keeping Love Alive as Memories Fade: The 5 Love Languages and the Alzheimer's Journey Dec 17, 2014 The Dementia Care Partner's Workbook: A Guide for Understanding, Education, and [employer] - August 16, 2017.  Both available for less than $15.   "Coping with behavior change in dementia: a family caregiver's guide" by Ricardo Jericho & Valora Piccolo "A Caregiver's Guide to Dementia: Using Activities and Other Strategies to Prevent, Reduce and Manage Behavioral Symptoms" by Mahala Menghini Gitlin and [employer].  Youth worker of Joy for the Person with Alzheimer's or Dementia" 4th edition by Tama High  Caregiver videos on common behaviors related to dementia: PopulationGame.pl  [place] Caregiver Portal: free to sign up, links to local resources: [email or web address]

## 2023-06-02 NOTE — Progress Notes (Unsigned)
 No chief complaint on file.   HISTORY OF PRESENT ILLNESS:  06/02/23 ALL:  Kristin Barnes is a 83 y.o. female here today for follow up for memory loss. She was seen in consult with Dr Frances Furbish 12/2022. Labs were unremarkable. MRI did show moderate chronic microvascular ischemic change and three chronic microhemorrhages. Sleep study showed mild OSA na dautoPAP advised.   Since,   Driving?   Consider aricept   HISTORY (copied from Dr Teofilo Pod previous note)  Dear Thea Silversmith,   I saw your patient, Kristin Barnes, upon your kind request in my neurologic clinic today for initial consultation of her memory loss.  The patient is accompanied by her sister, Garlan Fair, today as you know, Kristin Barnes is an 83 year old female with an underlying medical history of reflux disease, trigeminal neuralgia (by chart review), emphysema, allergic rhinitis, hypertension, hyperlipidemia, sleep apnea, arthritis, diabetes, AVM of the small bowel and obesity, who reports forgetfulness for the past couple of years.  She is not very detailed and elaborate in her history, reports that she is forgetful and misplaces things.  She drives.  She has had an incidence of getting lost driving a couple years ago, no details available.  She apparently also took the wrong turn recently per sister. They do have a family history of dementia, 2 sisters with dementia.  She has a large family, she had a total of 8 siblings, 6 of them all together are still alive.  She has 2 grown children.  She is retired, she could not tell me what job she had initially but then she thought of it with the help of some nudging from her sister.  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 She is a non-smoker.  She does not currently drink any alcohol.  She limits her caffeine, does not have to have caffeine every day.  Occasional soda or occasional coffee.  She drinks about 2 bottles of water per day she estimates, 16 ounce size.   She  does not have a CPAP machine, patient is unsure if she even had 1 but sister endorses that she had a PAP machine and it was over 10 years ago and the machine was taken back.   I reviewed your office note from 07/05/2022.  She has been on memantine, currently 5 mg twice daily.  She has been on vitamin B12 supplementation.  She has also been on primidone 50 mg daily for tremor.  She takes medication for bladder hyperactivity.  She is on a beta-blocker as well.  She had blood work through your office in April and I was able to review the results, she had a TSH, which was in the normal range, CBC unremarkable, CMP showed BUN of 11 and creatinine of 0.87, overall normal findings including electrolytes and liver function.  Lipid panel showed benign findings with total cholesterol of 157, triglycerides 70, LDL 79.  Blood work was from 06/25/2022.     Of note, she had a head CT without contrast through [facility] on 03/27/2020 and I have reviewed the results: Impression   Atrophy with small vessel chronic ischemic changes of deep cerebral white matter.   REVIEW OF SYSTEMS: Out of a complete 14 system review of symptoms, the patient complains only of the following symptoms, and all other reviewed systems are negative.   ALLERGIES: No Known Allergies   HOME MEDICATIONS: Outpatient Medications Prior to Visit  Medication Sig Dispense Refill   atorvastatin (LIPITOR) 10 MG  tablet Take 10 mg by mouth daily.     Cyanocobalamin (B-12 PO) Take 1 tablet by mouth daily.     Ferrous Sulfate (IRON PO) Take by mouth.     ferrous sulfate 325 (65 FE) MG tablet Take 1 tablet (325 mg total) by mouth daily. 30 tablet 1   furosemide (LASIX) 20 MG tablet Take 20 mg by mouth daily.     gabapentin (NEURONTIN) 300 MG capsule Take 300 mg by mouth daily.     losartan (COZAAR) 100 MG tablet Take 100 mg by mouth daily.     metoprolol tartrate (LOPRESSOR) 25 MG tablet Take 25 mg by mouth. 2 in the morning and one in the evening      oxybutynin (DITROPAN XL) 15 MG 24 hr tablet Take 15 mg by mouth at bedtime.     pantoprazole (PROTONIX) 40 MG tablet Take 1 tablet (40 mg total) by mouth daily. 60 tablet 0   primidone (MYSOLINE) 50 MG tablet Take 50 mg by mouth daily.     No facility-administered medications prior to visit.     PAST MEDICAL HISTORY: Past Medical History:  Diagnosis Date   Arthritis    hands and finger   Diabetes mellitus without complication (HCC)    GERD (gastroesophageal reflux disease)    uses club soda   Hyperlipidemia    Hypertension    Sleep apnea    states she had a CPAP machine at one time but was picked up...over 28yrs ago     PAST SURGICAL HISTORY: Past Surgical History:  Procedure Laterality Date   CHOLECYSTECTOMY     COLONOSCOPY WITH PROPOFOL N/A 05/18/2021   Procedure: COLONOSCOPY WITH PROPOFOL;  Surgeon: Dolores Frame, MD;  [facility];  Service: Gastroenterology;  Laterality: N/A;  200   ESOPHAGOGASTRODUODENOSCOPY (EGD) WITH PROPOFOL N/A 02/26/2022   Procedure: ESOPHAGOGASTRODUODENOSCOPY (EGD) WITH PROPOFOL;  Surgeon: Dolores Frame, MD;  [facility];  Service: Gastroenterology;  Laterality: N/A;  1145 ASA 3   HOT HEMOSTASIS  02/26/2022   Procedure: HOT HEMOSTASIS (ARGON PLASMA COAGULATION/BICAP);  Surgeon: Marguerita Merles, Reuel Boom, MD;  [facility];  Service: Gastroenterology;;   POLYPECTOMY  05/18/2021   Procedure: POLYPECTOMY;  Surgeon: Marguerita Merles, Reuel Boom, MD;  [facility];  Service: Gastroenterology;;   THYROIDECTOMY Right 08/19/2016   Procedure: RIGHT HEMI-THYROIDECTOMY;  Surgeon: Newman Pies, MD;  Location: [facility];  Service: ENT;  Laterality: Right;   TUBAL LIGATION       FAMILY HISTORY: Family History  Problem Relation Age of Onset   Diabetes Mother    CVA Mother    CAD Father    Memory loss Sister    Memory loss Sister    Breast cancer Sister      SOCIAL  HISTORY: Social History   Socioeconomic History   Marital status: Married    Spouse name: Not on file   Number of children: Not on file   Years of education: Not on file   Highest education level: Not on file  Occupational History   Not on file  Tobacco Use   Smoking status: Never    Passive exposure: Never   Smokeless tobacco: Never  Vaping Use   Vaping status: Never Used  Substance and Sexual Activity   Alcohol use: No   Drug use: No   Sexual activity: Yes  Other Topics Concern   Not on file  Social History Narrative   Not on file  Social Drivers of Corporate investment banker Strain: Not on file  Food Insecurity: Not on file  Transportation Needs: Not on file  Physical Activity: Not on file  Stress: Not on file  Social Connections: Not on file  Intimate Partner Violence: Not on file     PHYSICAL EXAM  There were no vitals filed for this visit. There is no height or weight on file to calculate BMI.  Generalized: Well developed, in no acute distress  Cardiology: normal rate and rhythm, no murmur auscultated  Respiratory: clear to auscultation bilaterally    Neurological examination  Mentation: Alert oriented to time, place, history taking. Follows all commands speech and language fluent Cranial nerve II-XII: Pupils were equal round reactive to light. Extraocular movements were full, visual field were full on confrontational test. Facial sensation and strength were normal. Uvula tongue midline. Head turning and shoulder shrug  were normal and symmetric. Motor: The motor testing reveals 5 over 5 strength of all 4 extremities. Good symmetric motor tone is noted throughout.  Sensory: Sensory testing is intact to soft touch on all 4 extremities. No evidence of extinction is noted.  Coordination: Cerebellar testing reveals good finger-nose-finger and heel-to-shin bilaterally.  Gait and station: Gait is normal. Tandem gait is normal. Romberg is negative. No drift is  seen.  Reflexes: Deep tendon reflexes are symmetric and normal bilaterally.    DIAGNOSTIC DATA (LABS, IMAGING, TESTING) - I reviewed patient records, labs, notes, testing and imaging myself where available.  Lab Results  Component Value Date   WBC 4.0 02/26/2022   HGB 10.6 (L) 03/25/2022   HCT 33.5 (L) 03/25/2022   MCV 91.7 02/26/2022   PLT 155 02/26/2022      Component Value Date/Time   NA 140 12/26/2022 0921   K 4.2 12/26/2022 0921   CL 101 12/26/2022 0921   CO2 24 12/26/2022 0921   GLUCOSE 89 12/26/2022 0921   GLUCOSE 100 (H) 05/30/2017 1958   BUN 10 12/26/2022 0921   CREATININE 0.82 12/26/2022 0921   CALCIUM 9.1 12/26/2022 0921   PROT 7.0 12/26/2022 0921   ALBUMIN 4.0 12/26/2022 0921   AST 25 12/26/2022 0921   ALT 13 12/26/2022 0921   ALKPHOS 151 (H) 12/26/2022 0921   BILITOT 0.5 12/26/2022 0921   GFRNONAA >60 05/30/2017 1958   GFRAA >60 05/30/2017 1958   No results found for: "CHOL", "HDL", "LDLCALC", "LDLDIRECT", "TRIG", "CHOLHDL" Lab Results  Component Value Date   HGBA1C 6.3 (H) 12/26/2022   Lab Results  Component Value Date   VITAMINB12 764 12/26/2022   Lab Results  Component Value Date   TSH 3.750 12/26/2022       12/26/2022    8:36 AM  MMSE - Mini Mental State Exam  Orientation to time 5  Orientation to Place 3  Registration 3  Attention/ Calculation 0  Recall 1  Language- name 2 objects 2  Language- repeat 1  Language- follow 3 step command 3  Language- read & follow direction 0  Write a sentence 0  Copy design 0  Total score 18         No data to display           ASSESSMENT AND PLAN  83 y.o. year old female  has a past medical history of Arthritis, Diabetes mellitus without complication (HCC), GERD (gastroesophageal reflux disease), Hyperlipidemia, Hypertension, and Sleep apnea. here with    No diagnosis found.  Audie Box ***.  Healthy lifestyle habits encouraged. ***  will follow up with PCP as directed. *** will  return to see me in ***, sooner if needed. *** verbalizes understanding and agreement with this plan.   No orders of the defined types were placed in this encounter.    No orders of the defined types were placed in this encounter.    Shawnie Dapper, MSN, FNP-C 06/02/2023, 11:16 AM  [facility] [address] [place], [place] [address] [phone]

## 2023-06-03 ENCOUNTER — Ambulatory Visit (INDEPENDENT_AMBULATORY_CARE_PROVIDER_SITE_OTHER): Payer: Medicare PPO | Admitting: Family Medicine

## 2023-06-03 ENCOUNTER — Encounter: Payer: Self-pay | Admitting: Family Medicine

## 2023-06-03 VITALS — BP 132/59 | HR 52 | Ht 62.0 in | Wt 213.0 lb

## 2023-06-03 DIAGNOSIS — F03A0 Unspecified dementia, mild, without behavioral disturbance, psychotic disturbance, mood disturbance, and anxiety: Secondary | ICD-10-CM | POA: Diagnosis not present

## 2023-06-03 DIAGNOSIS — M79605 Pain in left leg: Secondary | ICD-10-CM | POA: Diagnosis not present

## 2023-06-03 DIAGNOSIS — G4733 Obstructive sleep apnea (adult) (pediatric): Secondary | ICD-10-CM | POA: Diagnosis not present

## 2023-06-03 DIAGNOSIS — R413 Other amnesia: Secondary | ICD-10-CM | POA: Diagnosis not present

## 2023-06-03 DIAGNOSIS — Z299 Encounter for prophylactic measures, unspecified: Secondary | ICD-10-CM | POA: Diagnosis not present

## 2023-06-03 DIAGNOSIS — M79604 Pain in right leg: Secondary | ICD-10-CM | POA: Diagnosis not present

## 2023-06-06 DIAGNOSIS — K219 Gastro-esophageal reflux disease without esophagitis: Secondary | ICD-10-CM | POA: Diagnosis not present

## 2023-06-06 DIAGNOSIS — R1011 Right upper quadrant pain: Secondary | ICD-10-CM | POA: Diagnosis not present

## 2023-06-06 DIAGNOSIS — I10 Essential (primary) hypertension: Secondary | ICD-10-CM | POA: Diagnosis not present

## 2023-06-06 DIAGNOSIS — M179 Osteoarthritis of knee, unspecified: Secondary | ICD-10-CM | POA: Diagnosis not present

## 2023-06-06 DIAGNOSIS — Z299 Encounter for prophylactic measures, unspecified: Secondary | ICD-10-CM | POA: Diagnosis not present

## 2023-06-09 DIAGNOSIS — R921 Mammographic calcification found on diagnostic imaging of breast: Secondary | ICD-10-CM | POA: Diagnosis not present

## 2023-06-09 DIAGNOSIS — R928 Other abnormal and inconclusive findings on diagnostic imaging of breast: Secondary | ICD-10-CM | POA: Diagnosis not present

## 2023-06-15 DIAGNOSIS — I10 Essential (primary) hypertension: Secondary | ICD-10-CM | POA: Diagnosis not present

## 2023-06-16 ENCOUNTER — Ambulatory Visit (INDEPENDENT_AMBULATORY_CARE_PROVIDER_SITE_OTHER): Payer: Medicare PPO | Admitting: Gastroenterology

## 2023-06-16 ENCOUNTER — Encounter (INDEPENDENT_AMBULATORY_CARE_PROVIDER_SITE_OTHER): Payer: Self-pay | Admitting: Gastroenterology

## 2023-06-16 VITALS — BP 154/81 | HR 55 | Temp 97.5°F | Ht 62.0 in | Wt 211.4 lb

## 2023-06-16 DIAGNOSIS — K5520 Angiodysplasia of colon without hemorrhage: Secondary | ICD-10-CM

## 2023-06-16 DIAGNOSIS — Q2733 Arteriovenous malformation of digestive system vessel: Secondary | ICD-10-CM

## 2023-06-16 DIAGNOSIS — D500 Iron deficiency anemia secondary to blood loss (chronic): Secondary | ICD-10-CM

## 2023-06-16 DIAGNOSIS — D509 Iron deficiency anemia, unspecified: Secondary | ICD-10-CM | POA: Diagnosis not present

## 2023-06-16 NOTE — Patient Instructions (Addendum)
 Perform blood workup Continue daily oral iron supplementation

## 2023-06-16 NOTE — Progress Notes (Unsigned)
 Kristin Barnes, M.D. Gastroenterology & Hepatology [facility] [address] [place], [place] [address]  Primary Care Physician: Ignatius Specking, MD [address] [place] [address]  I will communicate my assessment and recommendations to the referring MD via EMR.  Problems: Small bowel AVM Iron deficiency anemia  History of Present Illness: Kristin Barnes is a 84 y.o. female w/  past medical history of  arthritis, DM, GERD, HLD, HTN, sleep apnea, small bowel AVM, who presents for follow up of iron deficiency anemia.  The patient was last seen on 03/25/2022. At that time, the patient was advised to continue PPI daily and to avoid NSAIDs.  Repeat hemoglobin was 10.6 on 03/25/2022.  She had iron studies checked which showed low iron saturation of 10% and iron of 40.  She was started on oral iron supplementation.  Has not had any repeat labs since then.  Patient denies having any complaints, besides some pain just under her ribcage. She is taking oral iron once a day compliantly. The patient denies having any nausea, vomiting, fever, chills, hematochezia, melena, hematemesis, abdominal distention, abdominal pain, diarrhea, jaundice, pruritus or weight loss.  Last Colonoscopy:05/18/21 One 3 mm polyp in the proximal ascending colon(tubular adenoma) - Diverticulosis in the sigmoid colon - Non-bleeding internal hemorrhoids. Last Endoscopy:02/26/22- 1 cm hiatal hernia. - Normal stomach. - A single non-bleeding angiodysplastic lesion in the duodenum. Treated with argon plasma coagulation (APC). - No specimens collected.  Past Medical History: Past Medical History:  Diagnosis Date   Arthritis    hands and finger   Diabetes mellitus without complication (HCC)    GERD (gastroesophageal reflux disease)    uses club soda   Hyperlipidemia    Hypertension    Sleep apnea    states she had a CPAP machine at one time but was picked up...over 37yrs ago     Past Surgical History: Past Surgical History:  Procedure Laterality Date   CHOLECYSTECTOMY     COLONOSCOPY WITH PROPOFOL N/A 05/18/2021   Procedure: COLONOSCOPY WITH PROPOFOL;  Surgeon: Dolores Frame, MD;  [facility];  Service: Gastroenterology;  Laterality: N/A;  200   ESOPHAGOGASTRODUODENOSCOPY (EGD) WITH PROPOFOL N/A 02/26/2022   Procedure: ESOPHAGOGASTRODUODENOSCOPY (EGD) WITH PROPOFOL;  Surgeon: Dolores Frame, MD;  [facility];  Service: Gastroenterology;  Laterality: N/A;  1145 ASA 3   HOT HEMOSTASIS  02/26/2022   Procedure: HOT HEMOSTASIS (ARGON PLASMA COAGULATION/BICAP);  Surgeon: Marguerita Merles, Reuel Boom, MD;  [facility];  Service: Gastroenterology;;   POLYPECTOMY  05/18/2021   Procedure: POLYPECTOMY;  Surgeon: Marguerita Merles, Reuel Boom, MD;  [facility];  Service: Gastroenterology;;   THYROIDECTOMY Right 08/19/2016   Procedure: RIGHT HEMI-THYROIDECTOMY;  Surgeon: Newman Pies, MD;  Location: [facility];  Service: ENT;  Laterality: Right;   TUBAL LIGATION      Family History: Family History  Problem Relation Age of Onset   Diabetes Mother    CVA Mother    CAD Father    Memory loss Sister    Memory loss Sister    Breast cancer Sister    Sleep apnea Neg Hx     Social History: Social History   Tobacco Use  Smoking Status Never   Passive exposure: Never  Smokeless Tobacco Never   Social History   Substance and Sexual Activity  Alcohol Use No   Social History   Substance and Sexual Activity  Drug Use No  Allergies: No Known Allergies  Medications: Current Outpatient Medications  Medication Sig Dispense Refill   atorvastatin (LIPITOR) 10 MG tablet Take 10 mg by mouth daily.     Cyanocobalamin (B-12 PO) Take 1 tablet by mouth daily.     Ferrous Sulfate (IRON PO) Take by mouth daily at 6 (six) AM.     furosemide (LASIX) 20 MG tablet Take 20 mg by mouth daily.      gabapentin (NEURONTIN) 300 MG capsule Take 300 mg by mouth daily.     losartan (COZAAR) 100 MG tablet Take 100 mg by mouth daily.     metoprolol tartrate (LOPRESSOR) 25 MG tablet Take 25 mg by mouth. 2 in the morning and one in the evening     oxybutynin (DITROPAN XL) 15 MG 24 hr tablet Take 15 mg by mouth at bedtime.     pantoprazole (PROTONIX) 40 MG tablet Take 1 tablet (40 mg total) by mouth daily. 60 tablet 0   primidone (MYSOLINE) 50 MG tablet Take 50 mg by mouth daily.     No current facility-administered medications for this visit.    Review of Systems: GENERAL: negative for malaise, night sweats HEENT: No changes in hearing or vision, no nose bleeds or other nasal problems. NECK: Negative for lumps, goiter, pain and significant neck swelling RESPIRATORY: Negative for cough, wheezing CARDIOVASCULAR: Negative for chest pain, leg swelling, palpitations, orthopnea GI: SEE HPI MUSCULOSKELETAL: Negative for joint pain or swelling, back pain, and muscle pain. SKIN: Negative for lesions, rash PSYCH: Negative for sleep disturbance, mood disorder and recent psychosocial stressors. HEMATOLOGY Negative for prolonged bleeding, bruising easily, and swollen nodes. ENDOCRINE: Negative for cold or heat intolerance, polyuria, polydipsia and goiter. NEURO: negative for tremor, gait imbalance, syncope and seizures. The remainder of the review of systems is noncontributory.   Physical Exam: BP (!) 154/81 (BP Location: Left Arm, Patient Position: Sitting, Cuff Size: Large)   Pulse (!) 55   Temp (!) 97.5 F (36.4 C) (Temporal)   Ht 5\' 2"  (1.575 m)   Wt 211 lb 6.4 oz (95.9 kg)   BMI 38.67 kg/m  GENERAL: The patient is AO x3, in no acute distress. HEENT: Head is normocephalic and atraumatic. EOMI are intact. Mouth is well hydrated and without lesions. NECK: Supple. No masses LUNGS: Clear to auscultation. No presence of rhonchi/wheezing/rales. Adequate chest expansion HEART: RRR, normal s1 and  s2. ABDOMEN: Soft, nontender, no guarding, no peritoneal signs, and nondistended. BS +. No masses. EXTREMITIES: Without any cyanosis, clubbing, rash, lesions or edema. NEUROLOGIC: AOx3, no focal motor deficit. SKIN: no jaundice, no rashes  Imaging/Labs: as above  I personally reviewed and interpreted the available labs, imaging and endoscopic files.  Impression and Plan: Kristin Barnes is a 83 y.o. female w/  past medical history of  arthritis, DM, GERD, HLD, HTN, sleep apnea, small bowel AVM, who presents for follow up of iron deficiency anemia.  Patient has had iron deficiency anemia, for which she underwent previous evaluation in the past with EGD and colonoscopy and had ablation of 1 AVM.  Has not presented any new symptoms and denies having any complaints.  Will check her iron stores today, I advised her to continue taking her oral iron daily.  -Check CBC and iron stores -Continue daily oral iron supplementation   All questions were answered.      Kristin Blazing, MD Gastroenterology and Hepatology [facility]

## 2023-06-17 LAB — CBC WITH DIFFERENTIAL/PLATELET
Absolute Lymphocytes: 1987 {cells}/uL (ref 850–3900)
Absolute Monocytes: 349 {cells}/uL (ref 200–950)
Basophils Absolute: 22 {cells}/uL (ref 0–200)
Basophils Relative: 0.6 %
Eosinophils Absolute: 61 {cells}/uL (ref 15–500)
Eosinophils Relative: 1.7 %
HCT: 36 % (ref 35.0–45.0)
Hemoglobin: 11.4 g/dL — ABNORMAL LOW (ref 11.7–15.5)
MCH: 29 pg (ref 27.0–33.0)
MCHC: 31.7 g/dL — ABNORMAL LOW (ref 32.0–36.0)
MCV: 91.6 fL (ref 80.0–100.0)
MPV: 13.1 fL — ABNORMAL HIGH (ref 7.5–12.5)
Monocytes Relative: 9.7 %
Neutro Abs: 1181 {cells}/uL — ABNORMAL LOW (ref 1500–7800)
Neutrophils Relative %: 32.8 %
Platelets: 170 10*3/uL (ref 140–400)
RBC: 3.93 10*6/uL (ref 3.80–5.10)
RDW: 13 % (ref 11.0–15.0)
Total Lymphocyte: 55.2 %
WBC: 3.6 10*3/uL — ABNORMAL LOW (ref 3.8–10.8)

## 2023-06-17 LAB — IRON,TIBC AND FERRITIN PANEL
%SAT: 34 % (ref 16–45)
Ferritin: 189 ng/mL (ref 16–288)
Iron: 103 ug/dL (ref 45–160)
TIBC: 300 ug/dL (ref 250–450)

## 2023-06-26 ENCOUNTER — Telehealth: Payer: Self-pay

## 2023-06-26 ENCOUNTER — Encounter: Payer: Self-pay | Admitting: Neurology

## 2023-06-26 ENCOUNTER — Ambulatory Visit: Payer: Medicare PPO | Admitting: Neurology

## 2023-06-26 VITALS — BP 172/64 | HR 55 | Ht 62.0 in | Wt 213.0 lb

## 2023-06-26 DIAGNOSIS — R030 Elevated blood-pressure reading, without diagnosis of hypertension: Secondary | ICD-10-CM

## 2023-06-26 DIAGNOSIS — I499 Cardiac arrhythmia, unspecified: Secondary | ICD-10-CM | POA: Diagnosis not present

## 2023-06-26 DIAGNOSIS — R001 Bradycardia, unspecified: Secondary | ICD-10-CM | POA: Diagnosis not present

## 2023-06-26 DIAGNOSIS — Z888 Allergy status to other drugs, medicaments and biological substances status: Secondary | ICD-10-CM | POA: Diagnosis not present

## 2023-06-26 DIAGNOSIS — I10 Essential (primary) hypertension: Secondary | ICD-10-CM | POA: Diagnosis not present

## 2023-06-26 DIAGNOSIS — G4733 Obstructive sleep apnea (adult) (pediatric): Secondary | ICD-10-CM | POA: Diagnosis not present

## 2023-06-26 DIAGNOSIS — R413 Other amnesia: Secondary | ICD-10-CM

## 2023-06-26 DIAGNOSIS — K5732 Diverticulitis of large intestine without perforation or abscess without bleeding: Secondary | ICD-10-CM | POA: Diagnosis not present

## 2023-06-26 DIAGNOSIS — Z79899 Other long term (current) drug therapy: Secondary | ICD-10-CM | POA: Diagnosis not present

## 2023-06-26 DIAGNOSIS — K219 Gastro-esophageal reflux disease without esophagitis: Secondary | ICD-10-CM | POA: Diagnosis not present

## 2023-06-26 DIAGNOSIS — K5792 Diverticulitis of intestine, part unspecified, without perforation or abscess without bleeding: Secondary | ICD-10-CM | POA: Diagnosis not present

## 2023-06-26 DIAGNOSIS — F419 Anxiety disorder, unspecified: Secondary | ICD-10-CM

## 2023-06-26 DIAGNOSIS — R52 Pain, unspecified: Secondary | ICD-10-CM | POA: Diagnosis not present

## 2023-06-26 DIAGNOSIS — Z7982 Long term (current) use of aspirin: Secondary | ICD-10-CM | POA: Diagnosis not present

## 2023-06-26 DIAGNOSIS — R1032 Left lower quadrant pain: Secondary | ICD-10-CM | POA: Diagnosis not present

## 2023-06-26 DIAGNOSIS — Z299 Encounter for prophylactic measures, unspecified: Secondary | ICD-10-CM | POA: Diagnosis not present

## 2023-06-26 DIAGNOSIS — J439 Emphysema, unspecified: Secondary | ICD-10-CM | POA: Diagnosis not present

## 2023-06-26 DIAGNOSIS — F03A0 Unspecified dementia, mild, without behavioral disturbance, psychotic disturbance, mood disturbance, and anxiety: Secondary | ICD-10-CM | POA: Diagnosis not present

## 2023-06-26 MED ORDER — MEMANTINE HCL 10 MG PO TABS: 10.0000 mg | ORAL_TABLET | Freq: Two times a day (BID) | ORAL | 1 refills | Status: AC

## 2023-06-26 NOTE — Telephone Encounter (Signed)
 done

## 2023-06-26 NOTE — Progress Notes (Signed)
 Subjective:    Patient ID: Kristin Barnes is a 83 y.o. female.  HPI    Interim history:    Kristin Barnes is an 83 year old female with an underlying medical history of reflux disease, trigeminal neuralgia (by chart review), emphysema, allergic rhinitis, hypertension, hyperlipidemia, sleep apnea, arthritis, diabetes, AVM of the small bowel and obesity, who presents for follow-up consultation of her memory loss and sleep apnea.  The patient is accompanied by her daughter today.  She was recently seen in this clinic by Amy Lomax on 06/03/2023, at which time she was not compliant with her AutoPap machine.  Her daughter reported that her memory was worse.  Her MMSE was 18 at the time which was stable from October 2024.  She was encouraged to get back on AutoPap therapy.  The omeprazole was discussed but patient and daughter wanted to wait.  Today, 06/26/2023: She reports very little, her daughter reports that memory decline has worsened since last month.  She is still on memantine 5 mg twice daily.  She has balance issues.  She is currently in a wheelchair.  She has had some lower extremity swelling.  She has had some lower abdominal pain on the right side, they have not spoken to the PCP about it yet but the daughter is planning to do so.  Patient has not been using her AutoPap machine.  Patient's son has moved in with them about a month ago.  She gets anxious especially at night.  She still is on primidone.   I reviewed her AutoPap compliance data for the past month, she has used her machine twice, 1 time for almost 8 hours and the other time for only less than 1 hour.  She had a brain MRI with and without contrast on 02/07/2023 and I reviewed the results:     IMPRESSION: This MRI of the brain with and without contrast shows the following: 1. Mild generalized cortical atrophy involving the medial right temporal lobe more than elsewhere. 2. Extensive single and confluent T2/FLAIR hyperintense foci in  the deep and subcortical white matter.  This is most consistent with moderate chronic microvascular ischemic change.  More than typical for age. 3. Three chronic microhemorrhages in the frontal lobes.  This is nonspecific and could be incidental.  Although the foci are at the gray-white junction in the hemispheres, which can be seen with cerebral amyloid angiopathy, the small number of foci makes this diagnosis unlikely. 4. Normal enhancement pattern.  No acute findings.   In addition, I personally and independently reviewed images through the PACS system.  She had a baseline sleep study through our sleep lab on 02/25/2023 which showed a total AHI of 11.3/hour, REM AHI of 57.8/hour, supine AHI of 11.6/hour and O2 nadir of 70% (during supine REM sleep).  She was advised to start home AutoPap therapy.    The patient's allergies, current medications, family history, past medical history, past social history, past surgical history and problem list were reviewed and updated as appropriate.    Previously:  12/26/2022: (She) reports forgetfulness for the past couple of years.  She is not very detailed and elaborate in her history, reports that she is forgetful and misplaces things.  She drives.  She has had an incidence of getting lost driving a couple years ago, no details available.  She apparently also took the wrong turn recently per sister. They do have a family history of dementia, 2 sisters with dementia.  She has a large family, she  had a total of 8 siblings, 6 of them all together are still alive.  She has 2 grown children.  She is retired, she could not tell me what job she had initially but then she thought of it with the help of some nudging from her sister.  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 She is a non-smoker.  She does not currently drink any alcohol.  She limits her caffeine, does not have to have caffeine every day.  Occasional soda or occasional coffee.   She drinks about 2 bottles of water per day she estimates, 16 ounce size.   She does not have a CPAP machine, patient is unsure if she even had 1 but sister endorses that she had a PAP machine and it was over 10 years ago and the machine was taken back.   I reviewed your office note from 07/05/2022.  She has been on memantine, currently 5 mg twice daily.  She has been on vitamin B12 supplementation.  She has also been on primidone 50 mg daily for tremor.  She takes medication for bladder hyperactivity.  She is on a beta-blocker as well.  She had blood work through your office in April and I was able to review the results, she had a TSH, which was in the normal range, CBC unremarkable, CMP showed BUN of 11 and creatinine of 0.87, overall normal findings including electrolytes and liver function.  Lipid panel showed benign findings with total cholesterol of 157, triglycerides 70, LDL 79.  Blood work was from 06/25/2022.     Of note, she had a head CT without contrast through [facility] on 03/27/2020 and I have reviewed the results: Impression   Atrophy with small vessel chronic ischemic changes of deep cerebral white matter.    Her Past Medical History Is Significant For: Past Medical History:  Diagnosis Date   Arthritis    hands and finger   Diabetes mellitus without complication (HCC)    GERD (gastroesophageal reflux disease)    uses club soda   Hyperlipidemia    Hypertension    Sleep apnea    states she had a CPAP machine at one time but was picked up...over 24yrs ago    Her Past Surgical History Is Significant For: Past Surgical History:  Procedure Laterality Date   CHOLECYSTECTOMY     COLONOSCOPY WITH PROPOFOL N/A 05/18/2021   Procedure: COLONOSCOPY WITH PROPOFOL;  Surgeon: Dolores Frame, MD;  Location: [facility];  Service: Gastroenterology;  Laterality: N/A;  200   ESOPHAGOGASTRODUODENOSCOPY (EGD) WITH PROPOFOL N/A 02/26/2022   Procedure: ESOPHAGOGASTRODUODENOSCOPY  (EGD) WITH PROPOFOL;  Surgeon: Dolores Frame, MD;  Location: [facility];  Service: Gastroenterology;  Laterality: N/A;  1145 ASA 3   HOT HEMOSTASIS  02/26/2022   Procedure: HOT HEMOSTASIS (ARGON PLASMA COAGULATION/BICAP);  Surgeon: Marguerita Merles, Reuel Boom, MD;  Location: [facility];  Service: Gastroenterology;;   POLYPECTOMY  05/18/2021   Procedure: POLYPECTOMY;  Surgeon: Dolores Frame, MD;  Location: [facility];  Service: Gastroenterology;;   THYROIDECTOMY Right 08/19/2016   Procedure: RIGHT HEMI-THYROIDECTOMY;  Surgeon: Newman Pies, MD;  Location: [facility];  Service: ENT;  Laterality: Right;   TUBAL LIGATION      Her Family History Is Significant For: Family History  Problem Relation Age of Onset   Diabetes Mother    CVA Mother    CAD Father    Memory loss Sister    Memory  loss Sister    Memory loss Sister    Breast cancer Sister    Sleep apnea Neg Hx     Her Social History Is Significant For: Social History   Socioeconomic History   Marital status: Married    Spouse name: Not on file   Number of children: Not on file   Years of education: Not on file   Highest education level: Not on file  Occupational History   Not on file  Tobacco Use   Smoking status: Never    Passive exposure: Never   Smokeless tobacco: Never  Vaping Use   Vaping status: Never Used  Substance and Sexual Activity   Alcohol use: No   Drug use: No   Sexual activity: Yes  Other Topics Concern   Not on file  Social History Narrative   Pt lives with husband    Retired    Chief Executive Officer [employer] investment banker Strain: Not on file  Food Insecurity: Not on file  Transportation Needs: Not on file  Physical Activity: Not on file  Stress: Not on file  Social Connections: Not on file    Her Allergies Are:  No Known Allergies:   Her Current Medications Are:  Outpatient Encounter Medications as of 06/26/2023  Medication Sig   atorvastatin  (LIPITOR) 10 MG tablet Take 10 mg by mouth daily.   Cyanocobalamin (B-12 PO) Take 1 tablet by mouth daily.   Ferrous Sulfate (IRON PO) Take by mouth daily at 6 (six) AM.   furosemide (LASIX) 20 MG tablet Take 20 mg by mouth daily.   gabapentin (NEURONTIN) 300 MG capsule Take 300 mg by mouth daily.   losartan (COZAAR) 100 MG tablet Take 100 mg by mouth daily.   memantine (NAMENDA) 5 MG tablet Take 5 mg by mouth 2 (two) times daily.   metoprolol tartrate (LOPRESSOR) 25 MG tablet Take 25 mg by mouth. 2 in the morning and one in the evening   oxybutynin (DITROPAN XL) 15 MG 24 hr tablet Take 15 mg by mouth at bedtime.   pantoprazole (PROTONIX) 40 MG tablet Take 1 tablet (40 mg total) by mouth daily.   primidone (MYSOLINE) 50 MG tablet Take 50 mg by mouth daily.   No facility-administered encounter medications on file as of 06/26/2023.  :  Review of Systems:  Out of a complete 14 point review of systems, all are reviewed and negative with the exception of these symptoms as listed below:   Review of Systems  Neurological:        Room 9 Pt is here with her Daughter. Pt's daughter states that pt memory has declined since her Appointment in March. Pt daughter states that pt can't remember what she did the day before. Pt's daughter states that pt is forgetting the days of the week. Pt's daughter states that pt doesn't remember what happened previously. Pt's daughter states pt is having Hallucinations to where she thinks someone is in her house. Pt's Daughter states she would like to know what they need to do now. Pt's Daughter states that when pt is around a lot of people she will get nervous and has a Tremor. Pt's Daughter would like to do lab work for [employer].     Objective:  Neurological Exam  Physical Exam Physical Examination:   Vitals:   06/26/23 1109  BP: (!) 172/64  Pulse: (!) 55    General Examination: The patient is a very pleasant 83 y.o. female in no  acute distress. She is in  a wheelchair.   HEENT: Normocephalic, atraumatic, pupils are equal, round and reactive to light, tracking is well-preserved, corrective eyeglasses in place.  Hearing appears to be grossly intact.  Face is symmetric.  Speech is scant.  No carotid bruits.    Chest: Clear to auscultation without wheezing, rhonchi or crackles noted.   Heart: S1+S2+0, regular and normal without murmurs, rubs or gallops noted.    Abdomen: Soft, non-tender and non-distended.   Extremities: There is 1 to 2+ pitting edema in the distal lower extremities bilaterally, worse on the left side.   Skin: Warm and dry without trophic changes noted.    Musculoskeletal: exam reveals no obvious joint deformities.    Neurologically:  Mental status: The patient is awake, pays attention, cooperative with the exam, memory is impaired.  She is unable to provide her history today.  Her daughter supplements and essentially provides the entire history today.       06/03/2023    1:17 PM 12/26/2022    8:36 AM  MMSE - Mini Mental State Exam  Orientation to time 3 5  Orientation to Place 3 3  Registration 3 3  Attention/ Calculation 0 0  Recall 3 1  Language- name 2 objects 2 2  Language- repeat 0 1  Language- follow 3 step command 3 3  Language- read & follow direction 1 0  Write a sentence 0 0  Copy design 0 0  Total score 18 18     On 12/26/2022: CDT: 2/4, AFT: 6/min.   Cranial nerves II - XII are as described above under HEENT exam.  Motor exam: Normal bulk, global strength of about 4 out of 5.  No obvious resting or action tremor.  Fine motor skills and coordination: Mildly impaired globally.   Cerebellar testing: No dysmetria or intention tremor. There is no truncal or gait ataxia.  Sensory exam: intact to light touch in the upper and lower extremities. Romberg is not tested due to safety concerns.  She is in a wheelchair, no walking aids.  I did not have her stand or walk for me today.   Assessment and Plan:   In summary, Kristin Barnes is an 83 year old female with an underlying medical history of reflux disease, trigeminal neuralgia (by chart review), emphysema, allergic rhinitis, hypertension, hyperlipidemia, sleep apnea, arthritis, diabetes, AVM of the small bowel and obesity, who presents for follow-up consultation of her memory loss and sleep apnea. She has been on memantine 5 mg twice daily for years.  She is advised to increase it to 10 mg twice daily at this time.  She has not been using her AutoPap machine and is encouraged to do so.  We talked about supportive treatments and the importance of fall prevention today.  I had advised them to talk to PCP about coming off of primidone as it can increase her fall risk and confusion.  She appears to still be on primidone.  They are reminded to talk to PCP about this and also advised to talk to PCP about her anxiety at night and her swelling in her left lower extremity.  We will plan to follow-up as scheduled in about 5 months in this clinic.  I answered all their questions today and the patient and her daughter were in agreement.   I spent 30 minutes in total face-to-face time and in reviewing records during pre-charting, more than 50% of which was spent in counseling and coordination of care, reviewing test  results, reviewing medications and treatment regimen and/or in discussing or reviewing the diagnosis of memory loss, sleep apnea, the prognosis and treatment options. Pertinent laboratory and imaging test results that were available during this visit with the patient were reviewed by me and considered in my medical decision making (see chart for details).

## 2023-06-26 NOTE — Patient Instructions (Addendum)
 It was nice to see you again today.  As discussed, we will increase your memory medication called memantine to 10 mg twice daily from currently 5 mg twice daily.  Please restart using your AutoPap machine consistently as treating sleep apnea may help your memory and may help you sleep better at night.  Please talk to your primary care about coming off of the medication called Mysoline or primidone, it can affect your memory adversely and can cause imbalance problems and sleepiness and increase your fall risk.    Please talk to your primary care about the swelling in your left leg and your abdominal pain as well as your anxiety.

## 2023-06-26 NOTE — Telephone Encounter (Signed)
 Noted, please pull DL for autoPAP.

## 2023-06-26 NOTE — Telephone Encounter (Signed)
 Called pt, but Daughter answered, told daughter that since her mother saw NP, Amy Nicholas Lose that we could cancel her Appointment for today 06/26/23 with Dr. Frances Furbish, Daughter stated that she was told by NP, Amy Lomax that they needed to keep pt's appointment due to pt memory fluctuating to monitor her memory. Pt's family are not canceling pt's appointment for today.

## 2023-07-03 ENCOUNTER — Telehealth: Payer: Self-pay | Admitting: Neurology

## 2023-07-03 DIAGNOSIS — I10 Essential (primary) hypertension: Secondary | ICD-10-CM | POA: Diagnosis not present

## 2023-07-03 DIAGNOSIS — Z1339 Encounter for screening examination for other mental health and behavioral disorders: Secondary | ICD-10-CM | POA: Diagnosis not present

## 2023-07-03 DIAGNOSIS — Z7189 Other specified counseling: Secondary | ICD-10-CM | POA: Diagnosis not present

## 2023-07-03 DIAGNOSIS — R5383 Other fatigue: Secondary | ICD-10-CM | POA: Diagnosis not present

## 2023-07-03 DIAGNOSIS — Z1331 Encounter for screening for depression: Secondary | ICD-10-CM | POA: Diagnosis not present

## 2023-07-03 DIAGNOSIS — Z0000 Encounter for general adult medical examination without abnormal findings: Secondary | ICD-10-CM | POA: Diagnosis not present

## 2023-07-03 DIAGNOSIS — E6609 Other obesity due to excess calories: Secondary | ICD-10-CM | POA: Diagnosis not present

## 2023-07-03 DIAGNOSIS — Z299 Encounter for prophylactic measures, unspecified: Secondary | ICD-10-CM | POA: Diagnosis not present

## 2023-07-03 DIAGNOSIS — G4733 Obstructive sleep apnea (adult) (pediatric): Secondary | ICD-10-CM | POA: Diagnosis not present

## 2023-07-03 DIAGNOSIS — E039 Hypothyroidism, unspecified: Secondary | ICD-10-CM | POA: Diagnosis not present

## 2023-07-03 DIAGNOSIS — E7800 Pure hypercholesterolemia, unspecified: Secondary | ICD-10-CM | POA: Diagnosis not present

## 2023-07-03 DIAGNOSIS — Z6837 Body mass index (BMI) 37.0-37.9, adult: Secondary | ICD-10-CM | POA: Diagnosis not present

## 2023-07-03 DIAGNOSIS — Z79899 Other long term (current) drug therapy: Secondary | ICD-10-CM | POA: Diagnosis not present

## 2023-07-03 NOTE — Telephone Encounter (Signed)
 Okay to try FFM.

## 2023-07-03 NOTE — Telephone Encounter (Signed)
 Received fax  to be signed for supplies.

## 2023-07-03 NOTE — Telephone Encounter (Signed)
 Charina @ [facility] [place]  ph [phone] fax#[phone] asking for Rx for CPAP supplies

## 2023-07-03 NOTE — Telephone Encounter (Signed)
 I called [facility] at the # listed.  The last visit 06-2023, order was for new machine and supplies, but specifically they need order for FFM and cushion.  They will fax order to us  for signature [phone].

## 2023-07-04 DIAGNOSIS — G4733 Obstructive sleep apnea (adult) (pediatric): Secondary | ICD-10-CM | POA: Diagnosis not present

## 2023-07-07 NOTE — Telephone Encounter (Signed)
 Signed and faxed back to [facility].  Orion Birks RN).

## 2023-07-08 DIAGNOSIS — R002 Palpitations: Secondary | ICD-10-CM | POA: Diagnosis not present

## 2023-07-11 DIAGNOSIS — K219 Gastro-esophageal reflux disease without esophagitis: Secondary | ICD-10-CM | POA: Diagnosis not present

## 2023-07-11 DIAGNOSIS — R251 Tremor, unspecified: Secondary | ICD-10-CM | POA: Diagnosis not present

## 2023-07-11 DIAGNOSIS — M545 Low back pain, unspecified: Secondary | ICD-10-CM | POA: Diagnosis not present

## 2023-07-11 DIAGNOSIS — I10 Essential (primary) hypertension: Secondary | ICD-10-CM | POA: Diagnosis not present

## 2023-07-11 DIAGNOSIS — F03A0 Unspecified dementia, mild, without behavioral disturbance, psychotic disturbance, mood disturbance, and anxiety: Secondary | ICD-10-CM | POA: Diagnosis not present

## 2023-07-11 DIAGNOSIS — Z299 Encounter for prophylactic measures, unspecified: Secondary | ICD-10-CM | POA: Diagnosis not present

## 2023-07-17 ENCOUNTER — Other Ambulatory Visit (INDEPENDENT_AMBULATORY_CARE_PROVIDER_SITE_OTHER): Payer: Self-pay | Admitting: Gastroenterology

## 2023-07-22 DIAGNOSIS — E2839 Other primary ovarian failure: Secondary | ICD-10-CM | POA: Diagnosis not present

## 2023-07-26 DIAGNOSIS — R002 Palpitations: Secondary | ICD-10-CM | POA: Diagnosis not present

## 2023-07-31 DIAGNOSIS — Z20828 Contact with and (suspected) exposure to other viral communicable diseases: Secondary | ICD-10-CM | POA: Diagnosis not present

## 2023-08-02 DIAGNOSIS — G4733 Obstructive sleep apnea (adult) (pediatric): Secondary | ICD-10-CM | POA: Diagnosis not present

## 2023-08-16 DIAGNOSIS — I10 Essential (primary) hypertension: Secondary | ICD-10-CM | POA: Diagnosis not present

## 2023-08-26 DIAGNOSIS — Z299 Encounter for prophylactic measures, unspecified: Secondary | ICD-10-CM | POA: Diagnosis not present

## 2023-08-26 DIAGNOSIS — I10 Essential (primary) hypertension: Secondary | ICD-10-CM | POA: Diagnosis not present

## 2023-08-26 DIAGNOSIS — E7800 Pure hypercholesterolemia, unspecified: Secondary | ICD-10-CM | POA: Diagnosis not present

## 2023-08-26 DIAGNOSIS — I479 Paroxysmal tachycardia, unspecified: Secondary | ICD-10-CM | POA: Diagnosis not present

## 2023-09-02 DIAGNOSIS — G4733 Obstructive sleep apnea (adult) (pediatric): Secondary | ICD-10-CM | POA: Diagnosis not present

## 2023-09-15 DIAGNOSIS — I10 Essential (primary) hypertension: Secondary | ICD-10-CM | POA: Diagnosis not present

## 2023-09-24 DIAGNOSIS — Z961 Presence of intraocular lens: Secondary | ICD-10-CM | POA: Diagnosis not present

## 2023-09-24 DIAGNOSIS — E119 Type 2 diabetes mellitus without complications: Secondary | ICD-10-CM | POA: Diagnosis not present

## 2023-09-24 DIAGNOSIS — H524 Presbyopia: Secondary | ICD-10-CM | POA: Diagnosis not present

## 2023-09-25 DIAGNOSIS — Z299 Encounter for prophylactic measures, unspecified: Secondary | ICD-10-CM | POA: Diagnosis not present

## 2023-09-25 DIAGNOSIS — I739 Peripheral vascular disease, unspecified: Secondary | ICD-10-CM | POA: Diagnosis not present

## 2023-09-25 DIAGNOSIS — F03A0 Unspecified dementia, mild, without behavioral disturbance, psychotic disturbance, mood disturbance, and anxiety: Secondary | ICD-10-CM | POA: Diagnosis not present

## 2023-09-25 DIAGNOSIS — I10 Essential (primary) hypertension: Secondary | ICD-10-CM | POA: Diagnosis not present

## 2023-09-25 DIAGNOSIS — Z0000 Encounter for general adult medical examination without abnormal findings: Secondary | ICD-10-CM | POA: Diagnosis not present

## 2023-10-02 DIAGNOSIS — G4733 Obstructive sleep apnea (adult) (pediatric): Secondary | ICD-10-CM | POA: Diagnosis not present

## 2023-10-08 ENCOUNTER — Ambulatory Visit: Admitting: Family Medicine

## 2023-10-17 DIAGNOSIS — I10 Essential (primary) hypertension: Secondary | ICD-10-CM | POA: Diagnosis not present

## 2023-10-17 DIAGNOSIS — E669 Obesity, unspecified: Secondary | ICD-10-CM | POA: Diagnosis not present

## 2023-10-17 DIAGNOSIS — Z7689 Persons encountering health services in other specified circumstances: Secondary | ICD-10-CM | POA: Diagnosis not present

## 2023-10-17 DIAGNOSIS — Z6837 Body mass index (BMI) 37.0-37.9, adult: Secondary | ICD-10-CM | POA: Diagnosis not present

## 2023-10-17 DIAGNOSIS — E785 Hyperlipidemia, unspecified: Secondary | ICD-10-CM | POA: Diagnosis not present

## 2023-10-17 DIAGNOSIS — G20A1 Parkinson's disease without dyskinesia, without mention of fluctuations: Secondary | ICD-10-CM | POA: Diagnosis not present

## 2023-10-17 DIAGNOSIS — K219 Gastro-esophageal reflux disease without esophagitis: Secondary | ICD-10-CM | POA: Diagnosis not present

## 2023-11-02 DIAGNOSIS — G4733 Obstructive sleep apnea (adult) (pediatric): Secondary | ICD-10-CM | POA: Diagnosis not present

## 2023-12-03 DIAGNOSIS — G4733 Obstructive sleep apnea (adult) (pediatric): Secondary | ICD-10-CM | POA: Diagnosis not present

## 2023-12-09 NOTE — Progress Notes (Unsigned)
 SABRA

## 2023-12-09 NOTE — Patient Instructions (Incomplete)
 Below is our plan:  We will continue memantine  10mg  twice daily.   Please continue using your CPAP regularly. While your insurance requires that you use CPAP at least 4 hours each night on 70% of the nights, I recommend, that you not skip any nights and use it throughout the night if you can. Getting used to CPAP and staying with the treatment long term does take time and patience and discipline. Untreated obstructive sleep apnea when it is moderate to severe can have an adverse impact on cardiovascular health and raise her risk for heart disease, arrhythmias, hypertension, congestive heart failure, stroke and diabetes. Untreated obstructive sleep apnea causes sleep disruption, nonrestorative sleep, and sleep deprivation. This can have an impact on your day to day functioning and cause daytime sleepiness and impairment of cognitive function, memory loss, mood disturbance, and problems focussing. Using CPAP regularly can improve these symptoms.  We will update supply orders, today. Tell your son I said thatnk you for reminding you to use it! Now I need you to listen.   Please make sure you are staying well hydrated. I recommend 50-60 ounces daily. Well balanced diet and regular exercise encouraged. Consistent sleep schedule with 6-8 hours recommended.   Please continue follow up with care team as directed.   Follow up with me in 6 months   You may receive a survey regarding today's visit. I encourage you to leave honest feed back as I do use this information to improve patient care. Thank you for seeing me today!   Management of Memory Problems   There are some general things you can do to help manage your memory problems.  Your memory may not in fact recover, but by using techniques and strategies you will be able to manage your memory difficulties better.   1)  Establish a routine. Try to establish and then stick to a regular routine.  By doing this, you will get used to what to expect and you  will reduce the need to rely on your memory.  Also, try to do things at the same time of day, such as taking your medication or checking your calendar first thing in the morning. Think about think that you can do as a part of a regular routine and make a list.  Then enter them into a daily planner to remind you.  This will help you establish a routine.   2)  Organize your environment. Organize your environment so that it is uncluttered.  Decrease visual stimulation.  Place everyday items such as keys or cell phone in the same place every day (ie.  Basket next to front door) Use post it notes with a brief message to yourself (ie. Turn off light, lock the door) Use labels to indicate where things go (ie. Which cupboards are for food, dishes, etc.) Keep a notepad and pen by the telephone to take messages   3)  Memory Aids A diary or journal/notebook/daily planner Making a list (shopping list, chore list, to do list that needs to be done) Using an alarm as a reminder (kitchen timer or cell phone alarm) Using cell phone to store information (Notes, Calendar, Reminders) Calendar/White board placed in a prominent position Post-it notes   In order for memory aids to be useful, you need to have good habits.  It's no good remembering to make a note in your journal if you don't remember to look in it.  Try setting aside a certain time of day to look  in journal.   4)  Improving mood and managing fatigue. There may be other factors that contribute to memory difficulties.  Factors, such as anxiety, depression and tiredness can affect memory. Regular gentle exercise can help improve your mood and give you more energy. Exercise: there are short videos created by the [employer] on Health specially for older adults: https://bit.ly/2I30q97.  Mediterranean diet: which emphasizes fruits, vegetables, whole grains, legumes, fish, and other seafood; unsaturated fats such as olive oils; and low amounts of red  meat, eggs, and sweets. A variation of this, called MIND (Mediterranean-DASH Intervention for Neurodegenerative Delay) incorporates the DASH (Dietary Approaches to Stop Hypertension) diet, which has been shown to lower high blood pressure, a risk factor for Alzheimer's disease. More information at: ExitMarketing.de.  Aerobic exercise that improve heart health is also good for the mind.  [employer] on Aging have short videos for exercises that you can do at home: BlindWorkshop.com.pt Simple relaxation techniques may help relieve symptoms of anxiety Try to get back to completing activities or hobbies you enjoyed doing in the past. Learn to pace yourself through activities to decrease fatigue. Find out about some local support groups where you can share experiences with others. Try and achieve 7-8 hours of sleep at night.   Tasks to improve attention/working memory 1. Good sleep hygiene (7-8 hrs of sleep) 2. Learning a new skill (Painting, Carpentry, Pottery, new language, Knitting). 3.Cognitive exercises (keep a daily journal, Puzzles) 4. Physical exercise and training  (30 min/day X 4 days week) 5. Being on Antidepressant if needed 6.Yoga, Meditation, Tai Chi 7. Decrease alcohol  intake 8.Have a clear schedule and structure in daily routine   MIND Diet: The Mediterranean-DASH Diet Intervention for Neurodegenerative Delay, or MIND diet, targets the health of the aging brain. Research participants with the highest MIND diet scores had a significantly slower rate of cognitive decline compared with those with the lowest scores. The effects of the MIND diet on cognition showed greater effects than either the Mediterranean or the DASH diet alone.   The healthy items the MIND diet guidelines suggest include:   3+ servings a day of whole grains 1+ servings a day of vegetables (other than green leafy) 6+ servings a  week of green leafy vegetables 5+ servings a week of nuts 4+ meals a week of beans 2+ servings a week of berries 2+ meals a week of poultry 1+ meals a week of fish Mainly olive oil if added fat is used   The unhealthy items, which are higher in saturated and trans fat, include: Less than 5 servings a week of pastries and sweets Less than 4 servings a week of red meat (including beef, pork, lamb, and products made from these meats) Less than one serving a week of cheese and fried foods Less than 1 tablespoon a day of butter/stick margarine

## 2023-12-09 NOTE — Progress Notes (Unsigned)
 No chief complaint on file.   HISTORY OF PRESENT ILLNESS:  12/09/23 ALL:  Kristin Barnes returns for follow up for memory loss and sleep apnea. She was last seen by Dr Taras 06/2023. Memantine  was increased to 10mg  BID and she was encouraged to continue working on using CPAP.   Since,   06/26/2023 SA:  She reports very little, her daughter reports that memory decline has worsened since last month.  She is still on memantine  5 mg twice daily.  She has balance issues.  She is currently in a wheelchair.  She has had some lower extremity swelling.  She has had some lower abdominal pain on the right side, they have not spoken to the PCP about it yet but the daughter is planning to do so.  Patient has not been using her AutoPap machine.  Patient's son has moved in with them about a month ago.  She gets anxious especially at night.  She still is on primidone.    I reviewed her AutoPap compliance data for the past month, she has used her machine twice, 1 time for almost 8 hours and the other time for only less than 1 hour.  06/03/2023 ALL: Kristin Barnes is a 83 y.o. female here today for follow up for memory loss. She was seen in consult with Dr Buck 12/2022. Labs were unremarkable. MRI did show moderate chronic microvascular ischemic change and three chronic microhemorrhages. Sleep study showed mild OSA and autoPAP advised.   HISTORY (copied from Dr Obie previous note)  Dear Kristin Barnes,   I saw your patient, Kristin Barnes, upon your kind request in my neurologic clinic today for initial consultation of her memory loss.  The patient is accompanied by her sister, Kristin Barnes, today as you know, Kristin Barnes is an 83 year old female with an underlying medical history of reflux disease, trigeminal neuralgia (by chart review), emphysema, allergic rhinitis, hypertension, hyperlipidemia, sleep apnea, arthritis, diabetes, AVM of the small bowel and obesity, who reports forgetfulness for the past couple of years.  She is  not very detailed and elaborate in her history, reports that she is forgetful and misplaces things.  She drives.  She has had an incidence of getting lost driving a couple years ago, no details available.  She apparently also took the wrong turn recently per sister. They do have a family history of dementia, 2 sisters with dementia.  She has a large family, she had a total of 8 siblings, 6 of them all together are still alive.  She has 2 grown children.  She is retired, she could not tell me what job she had initially but then she thought of it with the help of some nudging from her sister.  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 She is a non-smoker.  She does not currently drink any alcohol .  She limits her caffeine, does not have to have caffeine every day.  Occasional soda or occasional coffee.  She drinks about 2 bottles of water per day she estimates, 16 ounce size.   She does not have a CPAP machine, patient is unsure if she even had 1 but sister endorses that she had a PAP machine and it was over 10 years ago and the machine was taken back.   I reviewed your office note from 07/05/2022.  She has been on memantine , currently 5 mg twice daily.  She has been on vitamin B12 supplementation.  She has also been on primidone 50 mg  daily for tremor.  She takes medication for bladder hyperactivity.  She is on a beta-blocker as well.  She had blood work through your office in April and I was able to review the results, she had a TSH, which was in the normal range, CBC unremarkable, CMP showed BUN of 11 and creatinine of 0.87, overall normal findings including electrolytes and liver function.  Lipid panel showed benign findings with total cholesterol of 157, triglycerides 70, LDL 79.  Blood work was from 06/25/2022.     Of note, she had a head CT without contrast through [facility] on 03/27/2020 and I have reviewed the results: Impression   Atrophy with small vessel chronic ischemic  changes of deep cerebral white matter.   REVIEW OF SYSTEMS: Out of a complete 14 system review of symptoms, the patient complains only of the following symptoms, and all other reviewed systems are negative.   ALLERGIES: No Known Allergies   HOME MEDICATIONS: Outpatient Medications Prior to Visit  Medication Sig Dispense Refill   atorvastatin  (LIPITOR) 10 MG tablet Take 10 mg by mouth daily.     Cyanocobalamin  (B-12 PO) Take 1 tablet by mouth daily.     Ferrous Sulfate  (IRON PO) Take by mouth daily at 6 (six) AM.     furosemide (LASIX) 20 MG tablet Take 20 mg by mouth daily.     gabapentin (NEURONTIN) 300 MG capsule Take 300 mg by mouth daily.     losartan  (COZAAR ) 100 MG tablet Take 100 mg by mouth daily.     memantine  (NAMENDA ) 10 MG tablet Take 1 tablet (10 mg total) by mouth 2 (two) times daily. 180 tablet 1   metoprolol tartrate (LOPRESSOR) 25 MG tablet Take 25 mg by mouth. 2 in the morning and one in the evening     oxybutynin (DITROPAN XL) 15 MG 24 hr tablet Take 15 mg by mouth at bedtime.     pantoprazole  (PROTONIX ) 40 MG tablet TAKE 1 TABLET EVERY DAY (NEED OFFICE VISIT FOR MORE REFILLS) 60 tablet 5   primidone (MYSOLINE) 50 MG tablet Take 50 mg by mouth daily.     No facility-administered medications prior to visit.     PAST MEDICAL HISTORY: Past Medical History:  Diagnosis Date   Arthritis    hands and finger   Diabetes mellitus without complication (HCC)    GERD (gastroesophageal reflux disease)    uses club soda   Hyperlipidemia    Hypertension    Sleep apnea    states she had a CPAP machine at one time but was picked up...over 64yrs ago     PAST SURGICAL HISTORY: Past Surgical History:  Procedure Laterality Date   CHOLECYSTECTOMY     COLONOSCOPY WITH PROPOFOL  N/A 05/18/2021   Procedure: COLONOSCOPY WITH PROPOFOL ;  Surgeon: Eartha Angelia Sieving, MD;  Location: [facility];  Service: Gastroenterology;  Laterality: N/A;  200    ESOPHAGOGASTRODUODENOSCOPY (EGD) WITH PROPOFOL  N/A 02/26/2022   Procedure: ESOPHAGOGASTRODUODENOSCOPY (EGD) WITH PROPOFOL ;  Surgeon: Eartha Angelia Sieving, MD;  Location: [facility];  Service: Gastroenterology;  Laterality: N/A;  1145 ASA 3   HOT HEMOSTASIS  02/26/2022   Procedure: HOT HEMOSTASIS (ARGON PLASMA COAGULATION/BICAP);  Surgeon: Eartha Angelia, Sieving, MD;  Location: [facility];  Service: Gastroenterology;;   POLYPECTOMY  05/18/2021   Procedure: POLYPECTOMY;  Surgeon: Eartha Angelia Sieving, MD;  Location: [facility];  Service: Gastroenterology;;   THYROIDECTOMY Right 08/19/2016   Procedure: RIGHT HEMI-THYROIDECTOMY;  Surgeon: Karis Clunes, MD;  Location: [facility];  Service: ENT;  Laterality: Right;   TUBAL LIGATION       FAMILY HISTORY: Family History  Problem Relation Age of Onset   Diabetes Mother    CVA Mother    CAD Father    Memory loss Sister    Memory loss Sister    Memory loss Sister    Breast cancer Sister    Sleep apnea Neg Hx      SOCIAL HISTORY: Social History   Socioeconomic History   Marital status: Married    Spouse name: Not on file   Number of children: Not on file   Years of education: Not on file   Highest education level: Not on file  Occupational History   Not on file  Tobacco Use   Smoking status: Never    Passive exposure: Never   Smokeless tobacco: Never  Vaping Use   Vaping status: Never Used  Substance and Sexual Activity   Alcohol  use: No   Drug use: No   Sexual activity: Yes  Other Topics Concern   Not on file  Social History Narrative   Pt lives with husband    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  There is no height or weight on file to calculate BMI.  Generalized: Well developed, in no acute distress  Cardiology: normal rate and rhythm, no murmur auscultated  Respiratory: clear to auscultation bilaterally    Neurological examination  Mentation: Alert oriented to time, place, history taking. Follows all commands speech and language fluent Cranial nerve II-XII: Pupils were equal round reactive to light. Extraocular movements were full, visual field were full on confrontational test. Facial sensation and strength were normal. Uvula tongue midline. Head turning and shoulder shrug  were normal and symmetric. Motor: The motor testing reveals 5 over 5 strength of all 4 extremities. Good symmetric motor tone is noted throughout.  Sensory: Sensory testing is intact to soft touch on all 4 extremities. No evidence of extinction is noted.  Coordination: Cerebellar testing reveals good finger-nose-finger and heel-to-shin bilaterally.  Gait and station: Gait is normal. Tandem gait is normal. Romberg is negative. No drift is seen.  Reflexes: Deep tendon reflexes are symmetric and normal bilaterally.    DIAGNOSTIC DATA (LABS, IMAGING, TESTING) - I reviewed patient records, labs, notes, testing and imaging myself where available.  Lab Results  Component Value Date   WBC 3.6 (L) 06/16/2023   HGB 11.4 (L) 06/16/2023   HCT 36.0 06/16/2023   MCV 91.6 06/16/2023   PLT 170 06/16/2023      Component Value Date/Time   NA 140 12/26/2022 0921   K 4.2 12/26/2022 0921   CL 101 12/26/2022 0921   CO2 24 12/26/2022 0921   GLUCOSE 89 12/26/2022 0921   GLUCOSE 100 (H) 05/30/2017 1958   BUN 10 12/26/2022 0921   CREATININE 0.82 12/26/2022 0921   CALCIUM  9.1 12/26/2022 0921   PROT 7.0 12/26/2022 0921   ALBUMIN 4.0 12/26/2022 0921   AST 25 12/26/2022 0921   ALT 13 12/26/2022 0921   ALKPHOS 151 (H) 12/26/2022 0921   BILITOT 0.5 12/26/2022 9078  GFRNONAA >60 05/30/2017 1958   GFRAA >60  05/30/2017 1958   No results found for: CHOL, HDL, LDLCALC, LDLDIRECT, TRIG, CHOLHDL Lab Results  Component Value Date   HGBA1C 6.3 (H) 12/26/2022   Lab Results  Component Value Date   VITAMINB12 764 12/26/2022   Lab Results  Component Value Date   TSH 3.750 12/26/2022       06/03/2023    1:17 PM 12/26/2022    8:36 AM  MMSE - Mini Mental State Exam  Orientation to time 3 5  Orientation to Place 3 3  Registration 3 3  Attention/ Calculation 0 0  Recall 3 1  Language- name 2 objects 2 2  Language- repeat 0 1  Language- follow 3 step command 3 3  Language- read & follow direction 1 0  Write a sentence 0 0  Copy design 0 0  Total score 18 18         No data to display           ASSESSMENT AND PLAN  83 y.o. year old female  has a past medical history of Arthritis, Diabetes mellitus without complication (HCC), GERD (gastroesophageal reflux disease), Hyperlipidemia, Hypertension, and Sleep apnea. here with    No diagnosis found.  Mattalynn Crandle has not started autoPAP therapy but expresses a desire to begin. We have reviewed her sleep study results and I have educated her and her daughter on the need for nightly use. Her daughter will assist with reminders at night until she becomes more familiar with therapy. We have reviewed MRI results and discussed concerns of likely diagnosis of dementia. We have discussed trial of donepezil but she wishes to wait at this time. We will have her focus on using autoPAP nightly for at least 4 hours. We will consider donepezil at next visit. Healthy lifestyle habits encouraged. She will follow up with PCP as directed. She will return to see me in 6 months, sooner if needed. She verbalizes understanding and agreement with this plan.    No orders of the defined types were placed in this encounter.    No orders of the defined types were placed in this encounter.   I spent 30 minutes of face-to-face and non-face-to-face  time with patient.  This included previsit chart review, lab review, study review, order entry, electronic health record documentation, patient education.   Greig Forbes, MSN, FNP-C 12/09/2023, 11:09 AM  [facility] [address], [address] [place], [place] [address] [phone]

## 2023-12-10 ENCOUNTER — Ambulatory Visit (INDEPENDENT_AMBULATORY_CARE_PROVIDER_SITE_OTHER): Admitting: Family Medicine

## 2023-12-10 ENCOUNTER — Encounter: Payer: Self-pay | Admitting: Family Medicine

## 2023-12-10 VITALS — BP 125/73 | HR 65 | Ht 62.0 in | Wt 214.5 lb

## 2023-12-10 DIAGNOSIS — G4733 Obstructive sleep apnea (adult) (pediatric): Secondary | ICD-10-CM

## 2023-12-10 DIAGNOSIS — R413 Other amnesia: Secondary | ICD-10-CM

## 2023-12-17 DIAGNOSIS — G8929 Other chronic pain: Secondary | ICD-10-CM | POA: Diagnosis not present

## 2023-12-17 DIAGNOSIS — E782 Mixed hyperlipidemia: Secondary | ICD-10-CM | POA: Diagnosis not present

## 2023-12-17 DIAGNOSIS — M25561 Pain in right knee: Secondary | ICD-10-CM | POA: Diagnosis not present

## 2023-12-17 DIAGNOSIS — G20A1 Parkinson's disease without dyskinesia, without mention of fluctuations: Secondary | ICD-10-CM | POA: Diagnosis not present

## 2023-12-17 DIAGNOSIS — Z6839 Body mass index (BMI) 39.0-39.9, adult: Secondary | ICD-10-CM | POA: Diagnosis not present

## 2023-12-17 DIAGNOSIS — Z0001 Encounter for general adult medical examination with abnormal findings: Secondary | ICD-10-CM | POA: Diagnosis not present

## 2023-12-17 DIAGNOSIS — I10 Essential (primary) hypertension: Secondary | ICD-10-CM | POA: Diagnosis not present

## 2023-12-17 DIAGNOSIS — M25562 Pain in left knee: Secondary | ICD-10-CM | POA: Diagnosis not present

## 2023-12-19 DIAGNOSIS — M25569 Pain in unspecified knee: Secondary | ICD-10-CM | POA: Diagnosis not present

## 2023-12-19 DIAGNOSIS — Z7409 Other reduced mobility: Secondary | ICD-10-CM | POA: Diagnosis not present

## 2023-12-19 DIAGNOSIS — R262 Difficulty in walking, not elsewhere classified: Secondary | ICD-10-CM | POA: Diagnosis not present

## 2023-12-23 DIAGNOSIS — Z7409 Other reduced mobility: Secondary | ICD-10-CM | POA: Diagnosis not present

## 2023-12-23 DIAGNOSIS — M25569 Pain in unspecified knee: Secondary | ICD-10-CM | POA: Diagnosis not present

## 2023-12-23 DIAGNOSIS — R262 Difficulty in walking, not elsewhere classified: Secondary | ICD-10-CM | POA: Diagnosis not present

## 2023-12-26 DIAGNOSIS — M25569 Pain in unspecified knee: Secondary | ICD-10-CM | POA: Diagnosis not present

## 2023-12-26 DIAGNOSIS — Z7409 Other reduced mobility: Secondary | ICD-10-CM | POA: Diagnosis not present

## 2023-12-26 DIAGNOSIS — R262 Difficulty in walking, not elsewhere classified: Secondary | ICD-10-CM | POA: Diagnosis not present

## 2023-12-30 DIAGNOSIS — M25569 Pain in unspecified knee: Secondary | ICD-10-CM | POA: Diagnosis not present

## 2023-12-30 DIAGNOSIS — R262 Difficulty in walking, not elsewhere classified: Secondary | ICD-10-CM | POA: Diagnosis not present

## 2023-12-30 DIAGNOSIS — Z7409 Other reduced mobility: Secondary | ICD-10-CM | POA: Diagnosis not present

## 2024-01-02 DIAGNOSIS — G4733 Obstructive sleep apnea (adult) (pediatric): Secondary | ICD-10-CM | POA: Diagnosis not present

## 2024-01-12 DIAGNOSIS — M7061 Trochanteric bursitis, right hip: Secondary | ICD-10-CM | POA: Diagnosis not present

## 2024-01-12 DIAGNOSIS — Z6838 Body mass index (BMI) 38.0-38.9, adult: Secondary | ICD-10-CM | POA: Diagnosis not present

## 2024-01-14 DIAGNOSIS — Z7409 Other reduced mobility: Secondary | ICD-10-CM | POA: Diagnosis not present

## 2024-01-14 DIAGNOSIS — R262 Difficulty in walking, not elsewhere classified: Secondary | ICD-10-CM | POA: Diagnosis not present

## 2024-01-14 DIAGNOSIS — M25569 Pain in unspecified knee: Secondary | ICD-10-CM | POA: Diagnosis not present

## 2024-01-16 DIAGNOSIS — R262 Difficulty in walking, not elsewhere classified: Secondary | ICD-10-CM | POA: Diagnosis not present

## 2024-01-16 DIAGNOSIS — Z7409 Other reduced mobility: Secondary | ICD-10-CM | POA: Diagnosis not present

## 2024-01-16 DIAGNOSIS — M25569 Pain in unspecified knee: Secondary | ICD-10-CM | POA: Diagnosis not present

## 2024-01-19 DIAGNOSIS — Z7409 Other reduced mobility: Secondary | ICD-10-CM | POA: Diagnosis not present

## 2024-01-19 DIAGNOSIS — R262 Difficulty in walking, not elsewhere classified: Secondary | ICD-10-CM | POA: Diagnosis not present

## 2024-01-19 DIAGNOSIS — M25569 Pain in unspecified knee: Secondary | ICD-10-CM | POA: Diagnosis not present

## 2024-01-21 ENCOUNTER — Encounter (INDEPENDENT_AMBULATORY_CARE_PROVIDER_SITE_OTHER): Payer: Self-pay | Admitting: Gastroenterology

## 2024-01-22 DIAGNOSIS — R262 Difficulty in walking, not elsewhere classified: Secondary | ICD-10-CM | POA: Diagnosis not present

## 2024-01-22 DIAGNOSIS — M25569 Pain in unspecified knee: Secondary | ICD-10-CM | POA: Diagnosis not present

## 2024-01-22 DIAGNOSIS — Z7409 Other reduced mobility: Secondary | ICD-10-CM | POA: Diagnosis not present

## 2024-01-23 DIAGNOSIS — Z6838 Body mass index (BMI) 38.0-38.9, adult: Secondary | ICD-10-CM | POA: Diagnosis not present

## 2024-01-23 DIAGNOSIS — M79604 Pain in right leg: Secondary | ICD-10-CM | POA: Diagnosis not present

## 2024-01-23 DIAGNOSIS — M79605 Pain in left leg: Secondary | ICD-10-CM | POA: Diagnosis not present

## 2024-01-26 DIAGNOSIS — R921 Mammographic calcification found on diagnostic imaging of breast: Secondary | ICD-10-CM | POA: Diagnosis not present

## 2024-01-26 DIAGNOSIS — Z7409 Other reduced mobility: Secondary | ICD-10-CM | POA: Diagnosis not present

## 2024-01-26 DIAGNOSIS — R262 Difficulty in walking, not elsewhere classified: Secondary | ICD-10-CM | POA: Diagnosis not present

## 2024-01-26 DIAGNOSIS — R920 Mammographic microcalcification found on diagnostic imaging of breast: Secondary | ICD-10-CM | POA: Diagnosis not present

## 2024-01-26 DIAGNOSIS — R928 Other abnormal and inconclusive findings on diagnostic imaging of breast: Secondary | ICD-10-CM | POA: Diagnosis not present

## 2024-01-26 DIAGNOSIS — M25569 Pain in unspecified knee: Secondary | ICD-10-CM | POA: Diagnosis not present

## 2024-01-29 DIAGNOSIS — R262 Difficulty in walking, not elsewhere classified: Secondary | ICD-10-CM | POA: Diagnosis not present

## 2024-01-29 DIAGNOSIS — Z7409 Other reduced mobility: Secondary | ICD-10-CM | POA: Diagnosis not present

## 2024-01-29 DIAGNOSIS — M25569 Pain in unspecified knee: Secondary | ICD-10-CM | POA: Diagnosis not present

## 2024-01-30 DIAGNOSIS — R262 Difficulty in walking, not elsewhere classified: Secondary | ICD-10-CM | POA: Diagnosis not present

## 2024-01-30 DIAGNOSIS — Z7409 Other reduced mobility: Secondary | ICD-10-CM | POA: Diagnosis not present

## 2024-01-30 DIAGNOSIS — M25569 Pain in unspecified knee: Secondary | ICD-10-CM | POA: Diagnosis not present

## 2024-02-02 DIAGNOSIS — Z7409 Other reduced mobility: Secondary | ICD-10-CM | POA: Diagnosis not present

## 2024-02-02 DIAGNOSIS — M25569 Pain in unspecified knee: Secondary | ICD-10-CM | POA: Diagnosis not present

## 2024-02-02 DIAGNOSIS — R262 Difficulty in walking, not elsewhere classified: Secondary | ICD-10-CM | POA: Diagnosis not present

## 2024-02-06 DIAGNOSIS — I10 Essential (primary) hypertension: Secondary | ICD-10-CM | POA: Diagnosis not present

## 2024-02-06 DIAGNOSIS — K219 Gastro-esophageal reflux disease without esophagitis: Secondary | ICD-10-CM | POA: Diagnosis not present

## 2024-02-06 DIAGNOSIS — Z299 Encounter for prophylactic measures, unspecified: Secondary | ICD-10-CM | POA: Diagnosis not present

## 2024-02-06 DIAGNOSIS — F03A0 Unspecified dementia, mild, without behavioral disturbance, psychotic disturbance, mood disturbance, and anxiety: Secondary | ICD-10-CM | POA: Diagnosis not present

## 2024-02-06 DIAGNOSIS — S46819A Strain of other muscles, fascia and tendons at shoulder and upper arm level, unspecified arm, initial encounter: Secondary | ICD-10-CM | POA: Diagnosis not present

## 2024-02-06 DIAGNOSIS — R001 Bradycardia, unspecified: Secondary | ICD-10-CM | POA: Diagnosis not present

## 2024-06-30 ENCOUNTER — Ambulatory Visit: Admitting: Family Medicine

## 2025-01-17 ENCOUNTER — Ambulatory Visit: Admitting: Family Medicine
